# Patient Record
Sex: Female | Born: 1990 | Race: White | Hispanic: No | Marital: Married | Smoking: Current some day smoker
Health system: Southern US, Community
[De-identification: ages and names within clinical notes are randomized; demographics above are authoritative.]

## PROBLEM LIST (undated history)

## (undated) ENCOUNTER — Inpatient Hospital Stay: Payer: Self-pay

## (undated) DIAGNOSIS — G43909 Migraine, unspecified, not intractable, without status migrainosus: Secondary | ICD-10-CM

## (undated) DIAGNOSIS — F419 Anxiety disorder, unspecified: Secondary | ICD-10-CM

## (undated) DIAGNOSIS — F32A Depression, unspecified: Secondary | ICD-10-CM

## (undated) HISTORY — DX: Anxiety disorder, unspecified: F41.9

## (undated) HISTORY — DX: Depression, unspecified: F32.A

---
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
## 2009-04-20 ENCOUNTER — Emergency Department: Payer: Self-pay | Admitting: Emergency Medicine

## 2009-10-16 ENCOUNTER — Emergency Department: Payer: Self-pay | Admitting: Emergency Medicine

## 2010-08-31 ENCOUNTER — Emergency Department: Payer: Self-pay | Admitting: Emergency Medicine

## 2011-01-21 ENCOUNTER — Emergency Department: Payer: Self-pay | Admitting: Emergency Medicine

## 2011-09-01 ENCOUNTER — Emergency Department: Payer: Self-pay | Admitting: Emergency Medicine

## 2011-09-01 LAB — URINALYSIS, COMPLETE
Bilirubin,UR: NEGATIVE
Ketone: NEGATIVE
Protein: 100
RBC,UR: 352 /HPF (ref 0–5)
Specific Gravity: 1.016 (ref 1.003–1.030)
Squamous Epithelial: 4
WBC UR: 1822 /HPF (ref 0–5)

## 2011-09-01 LAB — PREGNANCY, URINE: Pregnancy Test, Urine: NEGATIVE m[IU]/mL

## 2012-01-12 ENCOUNTER — Emergency Department: Payer: Self-pay | Admitting: Emergency Medicine

## 2012-01-12 LAB — DRUG SCREEN, URINE
Barbiturates, Ur Screen: NEGATIVE (ref ?–200)
Benzodiazepine, Ur Scrn: NEGATIVE (ref ?–200)
Cannabinoid 50 Ng, Ur ~~LOC~~: NEGATIVE (ref ?–50)
Methadone, Ur Screen: NEGATIVE (ref ?–300)
Tricyclic, Ur Screen: NEGATIVE (ref ?–1000)

## 2012-01-12 LAB — CBC
HCT: 37.8 % (ref 35.0–47.0)
HGB: 13.5 g/dL (ref 12.0–16.0)
MCH: 32.2 pg (ref 26.0–34.0)
MCHC: 35.8 g/dL (ref 32.0–36.0)
Platelet: 292 10*3/uL (ref 150–440)
RBC: 4.19 10*6/uL (ref 3.80–5.20)
RDW: 13.4 % (ref 11.5–14.5)
WBC: 6.4 10*3/uL (ref 3.6–11.0)

## 2012-01-12 LAB — COMPREHENSIVE METABOLIC PANEL
Albumin: 4.5 g/dL (ref 3.4–5.0)
Alkaline Phosphatase: 72 U/L (ref 50–136)
Potassium: 3.5 mmol/L (ref 3.5–5.1)
SGOT(AST): 63 U/L — ABNORMAL HIGH (ref 15–37)
SGPT (ALT): 38 U/L (ref 12–78)
Total Protein: 8.2 g/dL (ref 6.4–8.2)

## 2012-01-12 LAB — URINALYSIS, COMPLETE
Bilirubin,UR: NEGATIVE
Glucose,UR: NEGATIVE mg/dL (ref 0–75)
Ketone: NEGATIVE
Nitrite: POSITIVE
Ph: 8 (ref 4.5–8.0)
RBC,UR: 57 /HPF (ref 0–5)
Squamous Epithelial: 2

## 2012-01-12 LAB — ETHANOL
Ethanol %: <0.003 % (ref 0.000–0.080)
Ethanol: <3 mg/dL

## 2012-01-12 LAB — TSH: Thyroid Stimulating Horm: 1.02 u[IU]/mL

## 2014-04-28 ENCOUNTER — Emergency Department
Admit: 2014-04-28 | Disposition: A | Discharge status: Home / Self Care | Payer: Self-pay | Admitting: Emergency Medicine

## 2015-04-20 ENCOUNTER — Encounter: Payer: Self-pay | Admitting: Emergency Medicine

## 2015-04-20 ENCOUNTER — Emergency Department
Admission: EM | Admit: 2015-04-20 | Discharge: 2015-04-20 | Disposition: A | Discharge status: Home / Self Care | Payer: Self-pay | Attending: Emergency Medicine | Admitting: Emergency Medicine

## 2015-04-20 DIAGNOSIS — R1013 Epigastric pain: Secondary | ICD-10-CM

## 2015-04-20 DIAGNOSIS — N390 Urinary tract infection, site not specified: Secondary | ICD-10-CM | POA: Insufficient documentation

## 2015-04-20 DIAGNOSIS — Z3202 Encounter for pregnancy test, result negative: Secondary | ICD-10-CM | POA: Insufficient documentation

## 2015-04-20 DIAGNOSIS — F17210 Nicotine dependence, cigarettes, uncomplicated: Secondary | ICD-10-CM | POA: Insufficient documentation

## 2015-04-20 LAB — URINALYSIS COMPLETE WITH MICROSCOPIC (ARMC ONLY)
BILIRUBIN URINE: NEGATIVE
GLUCOSE, UA: NEGATIVE mg/dL
Ketones, ur: NEGATIVE mg/dL
LEUKOCYTES UA: NEGATIVE
NITRITE: POSITIVE — AB
Protein, ur: NEGATIVE mg/dL
Specific Gravity, Urine: 1.012 (ref 1.005–1.030)
pH: 6 (ref 5.0–8.0)

## 2015-04-20 LAB — POCT PREGNANCY, URINE: Preg Test, Ur: NEGATIVE

## 2015-04-20 LAB — WET PREP, GENITAL
CLUE CELLS WET PREP: NONE SEEN
Sperm: NONE SEEN
Trich, Wet Prep: NONE SEEN
WBC WET PREP: NONE SEEN
YEAST WET PREP: NONE SEEN

## 2015-04-20 MED ORDER — CEPHALEXIN 500 MG PO CAPS: 500.0000 mg | ORAL_CAPSULE | Freq: Two times a day (BID) | ORAL | Status: DC

## 2015-04-20 MED ORDER — OMEPRAZOLE 40 MG PO CPDR: 40.0000 mg | DELAYED_RELEASE_CAPSULE | Freq: Every day | ORAL | Status: DC

## 2015-04-20 NOTE — Discharge Instructions (Signed)
Urinary Tract Infection A urinary tract infection (UTI) can occur any place along the urinary tract. The tract includes the kidneys, ureters, bladder, and urethra. A type of germ called bacteria often causes a UTI. UTIs are often helped with antibiotic medicine.  HOME CARE   If given, take antibiotics as told by your doctor. Finish them even if you start to feel better.  Drink enough fluids to keep your pee (urine) clear or pale yellow.  Avoid tea, drinks with caffeine, and bubbly (carbonated) drinks.  Pee often. Avoid holding your pee in for a long time.  Pee before and after having sex (intercourse).  Wipe from front to back after you poop (bowel movement) if you are a woman. Use each tissue only once. GET HELP RIGHT AWAY IF:   You have back pain.  You have lower belly (abdominal) pain.  You have chills.  You feel sick to your stomach (nauseous).  You throw up (vomit).  Your burning or discomfort with peeing does not go away.  You have a fever.  Your symptoms are not better in 3 days. MAKE SURE YOU:   Understand these instructions.  Will watch your condition.  Will get help right away if you are not doing well or get worse.   This information is not intended to replace advice given to you by your health care provider. Make sure you discuss any questions you have with your health care provider.   Document Released: 06/27/2007 Document Revised: 01/29/2014 Document Reviewed: 08/09/2011 Elsevier Interactive Patient Education [employer]2016 [employer]vier Inc.  Your urine lab shows a bladder infection. You should take the antibiotic as directed. Increase fluid intake to keep urine clear. Start the Nexium for any abdominal pain which may be related to reflux or gastritis. [facility] for continued symptoms.

## 2015-04-20 NOTE — ED Notes (Signed)
See triage note   States she has been having intermittent epigastric pain and dysuria for several months

## 2015-04-20 NOTE — ED Notes (Signed)
Pt presents to ED with epigastric pain and dysuria. Pt states burning upon urination has been for several months. Pt states has burning painful sensation in upper abdomen. Pt in NAD in triage.

## 2015-04-20 NOTE — ED Provider Notes (Signed)
[facility] Emergency Department Provider Note ____________________________________________  Time seen: 1022  I have reviewed the triage vital signs and the nursing notes.  HISTORY  Chief Complaint  Abdominal Pain and Dysuria  HPI Kaitlin Lewis is a 25 y.o. female reports to the EDfor evaluation of intermittent epigastric muscle pain and dysuria for several months. She has been dosing AZO for the last several weeks. She denies unintended weight loss, nausea, vomiting, or bowel changes. She reports the abdominal pain as tenderness to touch over the abdominal muscles. It is aggravated by bending, reaching and stretching the torso. She rates her discomfort at 8/10 in triage.   History reviewed. No pertinent past medical history.  There are no active problems to display for this patient.  History reviewed. No pertinent past surgical history.  Current Outpatient Rx  Name  Route  Sig  Dispense  Refill  . cephALEXin (KEFLEX) 500 MG capsule   Oral   Take 1 capsule (500 mg total) by mouth 2 (two) times daily.   14 capsule   0   . omeprazole (PRILOSEC) 40 MG capsule   Oral   Take 1 capsule (40 mg total) by mouth daily.   30 capsule   0    Allergies Review of patient's allergies indicates no known allergies.  No family history on file.  Social History Social History  Substance Use Topics  . Smoking status: Current Some Day Smoker    Types: Cigarettes  . Smokeless tobacco: None  . Alcohol Use: Yes     Comment: weekends   Review of Systems  Constitutional: Negative for fever. Cardiovascular: Negative for chest pain. Respiratory: Negative for shortness of breath. Gastrointestinal: Negative for abdominal pain, vomiting and diarrhea. Genitourinary: Positive for dysuria. Musculoskeletal: Negative for back pain. Reports abdominal wall pain as above. Skin: Negative for rash. Neurological: Negative for headaches, focal weakness or  numbness. ____________________________________________  PHYSICAL EXAM:  VITAL SIGNS: ED Triage Vitals  Enc Vitals Group     BP 04/20/15 0914 136/92 mmHg     Pulse Rate 04/20/15 0914 84     Resp 04/20/15 0914 18     Temp 04/20/15 0914 98.3 F (36.8 C)     Temp Source 04/20/15 0914 Oral     SpO2 04/20/15 0914 99 %     Weight 04/20/15 0914 125 lb (56.7 kg)     Height 04/20/15 0914  (1.575 m)     Head Cir --      Peak Flow --      Pain Score 04/20/15 0915 8     Pain Loc --      Pain Edu? --      Excl. in GC? --    Constitutional: Alert and oriented. Well appearing and in no distress. Head: Normocephalic and atraumatic. Eyes: Conjunctivae are normal. PERRL. Normal extraocular movements Hematological/Lymphatic/Immunological: No cervical lymphadenopathy. Cardiovascular: Normal rate, regular rhythm.  Respiratory: Normal respiratory effort. No wheezes/rales/rhonchi. Gastrointestinal: Soft and nontender. Normal bowel sounds. No distention, rebound, guarding, or organomegaly. No CVA tenderness. Patient with tenderness to the abdominal wall in the epigastric region. No abdominal wall defect, hernia, or lesions.  Musculoskeletal: Nontender with normal range of motion in all extremities.  Neurologic:  Normal gait without ataxia. Normal speech and language. No gross focal neurologic deficits are appreciated. Skin:  Skin is warm, dry and intact. No rash noted. Psychiatric: Mood and affect are normal. Patient exhibits appropriate insight and judgment. ____________________________________________   LABS (pertinent positives/negatives) Labs  Reviewed  URINALYSIS COMPLETEWITH MICROSCOPIC ([facility] ONLY) - Abnormal; Notable for the following:    Color, Urine YELLOW (*)    APPearance CLOUDY (*)    Hgb urine dipstick 2+ (*)    Nitrite POSITIVE (*)    Bacteria, UA RARE (*)    Squamous Epithelial / LPF TOO NUMEROUS TO COUNT (*)    All other components within normal limits  WET PREP, GENITAL   POC URINE PREG, ED  POCT PREGNANCY, URINE  ____________________________________________  ED ECG REPORT   Date: 04/20/2015  EKG Time: 0908  Rate: 70  Rhythm: normal sinus rhythm,  normal EKG, normal sinus rhythm, there are no previous tracings available for comparison Intervals:none ST&T Change: none Narrative Interpretation: normal ___________________________________________  INITIAL IMPRESSION / ASSESSMENT AND PLAN / ED COURSE  Patient with lab confirmation of a UTI and exam showing abdominal wall pain without an acute process. She will be discharged with a prescription for Keflex and omeprazole. She will follow-up with one of the community clinics or [facility] as needed.  ____________________________________________  FINAL CLINICAL IMPRESSION(S) / ED DIAGNOSES  Final diagnoses:  Abdominal wall pain in epigastric region  UTI (lower urinary tract infection)      Lissa HoardJenise V Bacon Gail Vendetti, PA-C 04/20/15 1631  Jeanmarie PlantJames A McShane, MD 04/21/15 1350

## 2016-03-01 ENCOUNTER — Encounter: Payer: Self-pay | Admitting: Emergency Medicine

## 2016-03-01 ENCOUNTER — Emergency Department
Admission: EM | Admit: 2016-03-01 | Discharge: 2016-03-01 | Disposition: A | Discharge status: Home / Self Care | Payer: Self-pay | Attending: Emergency Medicine | Admitting: Emergency Medicine

## 2016-03-01 DIAGNOSIS — N644 Mastodynia: Secondary | ICD-10-CM | POA: Insufficient documentation

## 2016-03-01 DIAGNOSIS — F17210 Nicotine dependence, cigarettes, uncomplicated: Secondary | ICD-10-CM | POA: Insufficient documentation

## 2016-03-01 MED ORDER — CEPHALEXIN 500 MG PO CAPS: 500.0000 mg | ORAL_CAPSULE | Freq: Three times a day (TID) | ORAL | 0 refills | Status: DC

## 2016-03-01 NOTE — ED Provider Notes (Signed)
[facility] Emergency Department Provider Note  ____________________________________________   First MD Initiated Contact with Patient 03/01/16 1052     (approximate)  I have reviewed the triage vital signs and the nursing notes.   HISTORY  Chief Complaint Painful lump right breast   HPI Kaitlin Lewis is a 26 y.o. female is here with complaint of lump to her right breast for 2 months. Patient states that she has had her right nipple pierced twice but had this removed approximately 2 months ago. Patient has seen green discharge from this area off and on. Patient denies any fever or chills. She currently has not seen any recent drainage but states the area is very tender. She's also had same symptoms to the left breast. Currently she rates her pain as 10 over 10.   History reviewed. No pertinent past medical history.  There are no active problems to display for this patient.   No past surgical history on file.  Prior to Admission medications   Medication Sig Start Date End Date Taking? Authorizing Provider  cephALEXin (KEFLEX) 500 MG capsule Take 1 capsule (500 mg total) by mouth 3 (three) times daily. 03/01/16   Tommi Rumpshonda L Madigan [employer], PA-C    Allergies Patient has no known allergies.  No family history on file.  Social History Social History  Substance Use Topics  . Smoking status: Current Some Day Smoker    Types: Cigarettes  . Smokeless tobacco: Not on file  . Alcohol use Yes     Comment: weekends    Review of Systems Constitutional: No fever/chills .Cardiovascular: Denies chest pain. Respiratory: Denies shortness of breath. Gastrointestinal: No abdominal pain.  No nausea, no vomiting.  Musculoskeletal: Negative for back pain. Skin: Positive bilateral pierced  Nipples.  Neurological: Negative for headaches, focal weakness or numbness.  10-point ROS otherwise negative.  ____________________________________________   PHYSICAL  EXAM:  VITAL SIGNS: ED Triage Vitals [03/01/16 1009]  Enc Vitals Group     BP 116/84     Pulse Rate 76     Resp 18     Temp 98 F (36.7 C)     Temp Source Oral     SpO2 100 %     Weight 123 lb (55.8 kg)     Height 5\' 2"  (1.575 m)     Head Circumference      Peak Flow      Pain Score 10     Pain Loc      Pain Edu?      Excl. in GC?     Constitutional: Alert and oriented. Well appearing and in no acute distress. Eyes: Conjunctivae are normal. PERRL. EOMI. Head: Atraumatic. Nose: No congestion/rhinnorhea. Neck: No stridor.   Cardiovascular: Normal rate, regular rhythm. Grossly normal heart sounds.  Good peripheral circulation. Respiratory: Normal respiratory effort.  No retractions. Lungs CTAB. Musculoskeletal: Moves upper and lower extremities without any difficulty. Normal gait was noted. Neurologic:  Normal speech and language. No gross focal neurologic deficits are appreciated. No gait instability. Skin:  Skin is warm, dry. Right nipple there is 2 pierced areas present. There is tenderness on palpation and slightly warm to touch. There is no active drainage noted at this time. Psychiatric: Mood and affect are normal. Speech and behavior are normal.  ____________________________________________   LABS (all labs ordered are listed, but only abnormal results are displayed)  Labs Reviewed - No data to display   PROCEDURES  Procedure(s) performed: None  Procedures  Critical  Care performed: No  ____________________________________________   INITIAL IMPRESSION / ASSESSMENT AND PLAN / ED COURSE  Pertinent labs & imaging results that were available during my care of the patient were reviewed by me and considered in my medical decision making (see chart for details).  Patient was told to begin using warm moist compresses to the area. Patient was started on Keflex 500 mg 3 times a day for 10 days. She is also told to continue taking Tylenol or ibuprofen as needed for  pain. She is to follow-up with [facility] or one of the health centers available to her set she does not have any insurance. She was told that most likely she would need to be evaluated for an abscess that there is no improvement. She is return to the emergency room if any severe worsening of her symptoms.      ____________________________________________   FINAL CLINICAL IMPRESSION(S) / ED DIAGNOSES  Final diagnoses:  Pain of right breast      NEW MEDICATIONS STARTED DURING THIS VISIT:  Discharge Medication List as of 03/01/2016 11:32 AM       Note:  This document was prepared using Dragon voice recognition software and may include unintentional dictation errors.    Tommi Rumps, PA-C 03/01/16 1550    Jeanmarie Plant, MD 03/02/16 1524

## 2016-03-01 NOTE — ED Triage Notes (Signed)
Pt reports painful lump to right breast for two months. Pt reports has piercing on right nipple but removed it. Pt reports has seen green discharge from lump.

## 2016-03-01 NOTE — ED Notes (Signed)
Pt ambulatory to ER via POV. Pt alert and oriented X4, active, cooperative, pt in NAD. RR even and unlabored, color WNL.

## 2016-03-01 NOTE — ED Notes (Signed)
States pain in her right brests, states greenish nipple discharge, states hx of piecing's, upon visual assessment no abnormality noted but pt states tenderness and swelling

## 2016-03-01 NOTE — Discharge Instructions (Signed)
Follow-up with your primary care doctor or one of the clinics listed above if you do not already have a primary care doctor. [facility]'re also [facility]. Continue using warm compresses as we discussed. Begin taking antibiotics for the whole 10 days. You may take Tylenol or ibuprofen if needed for pain.

## 2016-04-24 ENCOUNTER — Encounter: Payer: Self-pay | Admitting: Emergency Medicine

## 2016-04-24 ENCOUNTER — Emergency Department: Payer: Self-pay

## 2016-04-24 ENCOUNTER — Emergency Department
Admission: EM | Admit: 2016-04-24 | Discharge: 2016-04-24 | Disposition: A | Discharge status: Home / Self Care | Payer: Self-pay | Attending: Emergency Medicine | Admitting: Emergency Medicine

## 2016-04-24 DIAGNOSIS — S20469A Insect bite (nonvenomous) of unspecified back wall of thorax, initial encounter: Secondary | ICD-10-CM | POA: Insufficient documentation

## 2016-04-24 DIAGNOSIS — F17210 Nicotine dependence, cigarettes, uncomplicated: Secondary | ICD-10-CM | POA: Insufficient documentation

## 2016-04-24 DIAGNOSIS — Y999 Unspecified external cause status: Secondary | ICD-10-CM | POA: Insufficient documentation

## 2016-04-24 DIAGNOSIS — R0602 Shortness of breath: Secondary | ICD-10-CM | POA: Insufficient documentation

## 2016-04-24 DIAGNOSIS — R0781 Pleurodynia: Secondary | ICD-10-CM | POA: Insufficient documentation

## 2016-04-24 DIAGNOSIS — Y929 Unspecified place or not applicable: Secondary | ICD-10-CM | POA: Insufficient documentation

## 2016-04-24 DIAGNOSIS — W57XXXA Bitten or stung by nonvenomous insect and other nonvenomous arthropods, initial encounter: Secondary | ICD-10-CM | POA: Insufficient documentation

## 2016-04-24 DIAGNOSIS — Y939 Activity, unspecified: Secondary | ICD-10-CM | POA: Insufficient documentation

## 2016-04-24 LAB — TROPONIN I
Troponin I: <0.03 ng/mL (ref <0.03)
Troponin I: <0.03 ng/mL (ref <0.03)

## 2016-04-24 LAB — BASIC METABOLIC PANEL
Anion gap: 8 (ref 5–15)
BUN: 7 mg/dL (ref 6–20)
CHLORIDE: 103 mmol/L (ref 101–111)
CO2: 26 mmol/L (ref 22–32)
Calcium: 9.4 mg/dL (ref 8.9–10.3)
Creatinine, Ser: 0.79 mg/dL (ref 0.44–1.00)
GFR calc non Af Amer: >60 mL/min (ref >60)
Glucose, Bld: 109 mg/dL — ABNORMAL HIGH (ref 65–99)
POTASSIUM: 3.7 mmol/L (ref 3.5–5.1)
SODIUM: 137 mmol/L (ref 135–145)

## 2016-04-24 LAB — CBC
HEMATOCRIT: 33.6 % — AB (ref 35.0–47.0)
HEMOGLOBIN: 11.6 g/dL — AB (ref 12.0–16.0)
MCH: 31.4 pg (ref 26.0–34.0)
MCHC: 34.4 g/dL (ref 32.0–36.0)
MCV: 91.3 fL (ref 80.0–100.0)
PLATELETS: 359 10*3/uL (ref 150–440)
RBC: 3.68 MIL/uL — AB (ref 3.80–5.20)
RDW: 13.8 % (ref 11.5–14.5)
WBC: 3.6 10*3/uL (ref 3.6–11.0)

## 2016-04-24 LAB — POCT PREGNANCY, URINE: PREG TEST UR: NEGATIVE

## 2016-04-24 MED ORDER — OXYCODONE-ACETAMINOPHEN 5-325 MG PO TABS: 1.0000 | ORAL_TABLET | Freq: Four times a day (QID) | ORAL | 0 refills | Status: DC | PRN

## 2016-04-24 MED ORDER — IOPAMIDOL (ISOVUE-370) INJECTION 76%
75.0000 mL | Freq: Once | INTRAVENOUS | Status: AC | PRN
  Administered 2016-04-24: 75 mL via INTRAVENOUS
  Filled 2016-04-24: qty 75

## 2016-04-24 MED ORDER — DOXYCYCLINE HYCLATE 100 MG PO CAPS: 100.0000 mg | ORAL_CAPSULE | Freq: Two times a day (BID) | ORAL | 0 refills | Status: DC

## 2016-04-24 MED ORDER — KETOROLAC TROMETHAMINE 30 MG/ML IJ SOLN
30.0000 mg | Freq: Once | INTRAMUSCULAR | Status: AC
  Administered 2016-04-24: 30 mg via INTRAVENOUS

## 2016-04-24 MED ORDER — KETOROLAC TROMETHAMINE 30 MG/ML IJ SOLN
INTRAMUSCULAR | Status: AC
  Filled 2016-04-24: qty 1

## 2016-04-24 MED ORDER — OXYCODONE-ACETAMINOPHEN 5-325 MG PO TABS
1.0000 | ORAL_TABLET | ORAL | Status: AC
  Administered 2016-04-24: 1 via ORAL
  Filled 2016-04-24: qty 1

## 2016-04-24 MED ORDER — IPRATROPIUM-ALBUTEROL 0.5-2.5 (3) MG/3ML IN SOLN
3.0000 mL | Freq: Once | RESPIRATORY_TRACT | Status: AC
  Administered 2016-04-24: 3 mL via RESPIRATORY_TRACT
  Filled 2016-04-24: qty 3

## 2016-04-24 MED ORDER — ALBUTEROL SULFATE HFA 108 (90 BASE) MCG/ACT IN AERS: 2.0000 | INHALATION_SPRAY | Freq: Four times a day (QID) | RESPIRATORY_TRACT | 2 refills | Status: DC | PRN

## 2016-04-24 NOTE — ED Triage Notes (Signed)
Pt to ed with c/o chest pain x 3 days,  Pt states after she chain smokes the pain happens,  Pt reports sob with chest pain.

## 2016-04-24 NOTE — Discharge Instructions (Signed)
Please take any prescribed medications as instructed.  Follow up with your regular physician as instructed above, and return to the Emergency Department (ED) if you are unable to tolerate fluids due to vomiting, have worsening trouble breathing, become extremely tired or difficult to awaken, pain worsens, you have a fever, weakness, or if you develop any other symptoms that concern you.

## 2016-04-24 NOTE — ED Notes (Signed)
Collected cbc, bmp, troponin, sent to lab.  

## 2016-04-24 NOTE — ED Provider Notes (Signed)
[facility] Emergency Department Provider Note   ____________________________________________   First MD Initiated Contact with Patient 04/24/16 1806     (approximate)  I have reviewed the triage vital signs and the nursing notes.   HISTORY  Chief Complaint Chest Pain    HPI Kaitlin Lewis is a 26 y.o. female  she's having severe sharp pain in the left chest and long area since Sunday night.  Reports that she was smoking quite a bit, and at times severe chest discomfort after smoking, but beginning yesterday and some on Sunday night she's been having fairly severe pain with any type of deep breath and a feeling of slight shortness of breath like the "pain takes breath away" over the left chest and  She is not any birth control. Denies any history of blood clots, recent hospitalizations or trauma. She has not noticed any leg swelling. She has no personal history of heart disease. Denies any heavy chest pressure, but rather describes a very sharp left-sided chest pain worse by inspiration and movement. Also reports tobacco or throat feels a little irritated, describes this happens after smoking as well. She tells me that she is stop smoking, but she threw her cigarettes out and has not had anything else to smoke since Sunday.  Unclear of her family history, she does report however that she is not aware of any heart problems in her mom or dad that her mother has a long-standing history of drug and substance abuse, and her father is a heavy alcoholic  History reviewed. No pertinent past medical history.  There are no active problems to display for this patient.   History reviewed. No pertinent surgical history.  Prior to Admission medications   Medication Sig Start Date End Date Taking? Authorizing Provider  albuterol (PROVENTIL HFA;VENTOLIN HFA) 108 (90 Base) MCG/ACT inhaler Inhale 2 puffs into the lungs every 6 (six) hours as needed for wheezing or  shortness of breath. 04/24/16   Sharyn Creamer, MD  cephALEXin (KEFLEX) 500 MG capsule Take 1 capsule (500 mg total) by mouth 3 (three) times daily. 03/01/16   Tommi Rumps, PA-C  doxycycline (VIBRAMYCIN) 100 MG capsule Take 1 capsule (100 mg total) by mouth 2 (two) times daily. 04/24/16   Sharyn Creamer, MD  oxyCODONE-acetaminophen (ROXICET) 5-325 MG tablet Take 1 tablet by mouth every 6 (six) hours as needed for severe pain. 04/24/16   Sharyn Creamer, MD    Allergies Patient has no known allergies.  No family history on file.  Social History Social History  Substance Use Topics  . Smoking status: Current Some Day Smoker    Types: Cigarettes  . Smokeless tobacco: Never Used  . Alcohol use Yes     Comment: weekends    Review of Systems Constitutional: No fever/chills Eyes: No visual changes. ENT: No sore throat. Cardiovascular: see history of present illness Respiratory:See history of present illness. denies any wheezing. Gastrointestinal: No abdominal pain.  No nausea, no vomiting.   Genitourinary: Negative for dysuria.denies pregnancy. Musculoskeletal: Negative for back pain. Skin: Negative for rash. Neurological: Negative for headaches, focal weakness or numbness.  10-point ROS otherwise negative.  ____________________________________________   PHYSICAL EXAM:  VITAL SIGNS: ED Triage Vitals [04/24/16 1618]  Enc Vitals Group     BP 135/86     Pulse Rate 81     Resp 18     Temp 98.6 F (37 C)     Temp Source Oral     SpO2 100 %  Weight 120 lb (54.4 kg)     Height  (1.575 m)     Head Circumference      Peak Flow      Pain Score 10     Pain Loc      Pain Edu?      Excl. in GC?     Constitutional: Alert and oriented. Splinting somewhat, holdingher left chest with her hand reporting severe sharp pain in the left chest with takes deep breath. Moderate pain Eyes: Conjunctivae are normal. PERRL. EOMI. Head: Atraumatic. Nose: No congestion/rhinnorhea. Mouth/Throat:  Mucous membranes are moist.  Oropharynx non-erythematous. Neck: No stridor.   Cardiovascular: Normal rate, regular rhythm. Grossly normal heart sounds.  Good peripheral circulation. Respiratory: Normal respiratory effort.  Splints when taking a deep breath. No wheezing. Able speak in full sentences which are clearNo retractions. Lungs CTAB. Gastrointestinal: Soft and nontender. No distention.  Musculoskeletal: No lower extremity tenderness nor edema.  No joint effusions. Neurologic:  Normal speech and language. No gross focal neurologic deficits are appreciated.  Skin:  Skin is warm, dry and intact. No rash noted. Psychiatric: Mood and affect are normal. Speech and behavior are normal.  ____________________________________________   LABS (all labs ordered are listed, but only abnormal results are displayed)  Labs Reviewed  BASIC METABOLIC PANEL - Abnormal; Notable for the following:       Result Value   Glucose, Bld 109 (*)    All other components within normal limits  CBC - Abnormal; Notable for the following:    RBC 3.68 (*)    Hemoglobin 11.6 (*)    HCT 33.6 (*)    All other components within normal limits  TROPONIN I  TROPONIN I  POC URINE PREG, ED  POCT PREGNANCY, URINE   ____________________________________________  EKG  Reviewed and interpreted by me at 1622 Heart rate 90 QRS 70 QTc 450 No acute ischemic changes are noted, slight RSR prime pattern is noted in V1 and V2 which is nonspecific. No evidence of acute ischemia ____________________________________________  RADIOLOGY  Dg Chest 2 View  Result Date: 04/24/2016 CLINICAL DATA:  Chest pain for 3 days EXAM: CHEST  2 VIEW COMPARISON:  12/08/2007 FINDINGS: Cardiomediastinal silhouette is stable. No infiltrate or pleural effusion. No pulmonary edema. Bony thorax is unremarkable. IMPRESSION: No active cardiopulmonary disease. Electronically Signed   By: Natasha Mead M.D.   On: 04/24/2016 16:52   Ct Angio Chest Pe W Or  Wo Contrast  Result Date: 04/24/2016 CLINICAL DATA:  Chest pain and shortness of breath EXAM: CT ANGIOGRAPHY CHEST WITH CONTRAST TECHNIQUE: Multidetector CT imaging of the chest was performed using the standard protocol during bolus administration of intravenous contrast. Multiplanar CT image reconstructions and MIPs were obtained to evaluate the vascular anatomy. CONTRAST:  75 mL Isovue 370 nonionic COMPARISON:  Chest radiograph   April 24, 2016 FINDINGS: Cardiovascular: There is no demonstrable pulmonary embolus. There is no thoracic aortic aneurysm or dissection. The visualized great vessels appear normal. There is no appreciable pericardial thickening. Mediastinum/Nodes: Visualized thyroid appears normal. There is no demonstrable adenopathy. Lungs/Pleura: Lungs are clear. No pleural effusion or pleural thickening. Upper Abdomen: Visualized upper abdominal structures appear normal. Musculoskeletal: There is slight mid thoracic dextroscoliosis. No blastic or lytic bone lesions. No chest wall lesions evident. Review of the MIP images confirms the above findings. IMPRESSION: No demonstrable pulmonary embolus. No edema or consolidation. No adenopathy. Electronically Signed   By: Bretta Bang III M.D.   On: 04/24/2016 18:57  ____________________________________________   PROCEDURES  Procedure(s) performed: None  Procedures  Critical Care performed: No  ____________________________________________   INITIAL IMPRESSION / ASSESSMENT AND PLAN / ED COURSE  Pertinent labs & imaging results that were available during my care of the patient were reviewed by me and considered in my medical decision making (see chart for details).  Chest pain, atypical acute coronary syndrome. Reassuring EKG and troponin. Low risk by heart score. Primarily pleuritic in nature, possibly due to irritation, pleurisy, related to smoking but we'll exclude pulmonary embolism given severity of symptoms, reported shortness  of breath, and significant pleuritic nose of the symptoms in association with smoking.  Personal pretest probability of pulmonary embolism as somewhat moderate to low at this time, given her smoking history and report of severe pleuritic pain with shortness of breath and a CT scan.    ----------------------------------------- 8:08 PM on 04/24/2016 -----------------------------------------  Ambulatory. Feels improved. Clear lungs no wheezing or evidence of increased work of breathing. Speaking in full clear sentences. Reports her pain is mild now, and much more tolerable with persistent slight pleuritic component over the left chest.  Feel patient may be discharged home, brief course of pain medication with recommendation also utilize over-the-counter ibuprofen. Doxycycline also prescribed for 2 tick bites which she's had on her back and had to have removed a couple days ago.  Return precautions and treatment recommendations and follow-up discussed with the patient who is agreeable with the plan.  I will prescribe the patient a narcotic pain medicine due to their condition which I anticipate will cause at least moderate pain short term. I discussed with the patient safe use of narcotic pain medicines, and that they are not to drive, work in dangerous areas, or ever take more than prescribed (no more than 1 pill every 6 hours). We discussed that this is the type of medication that can be  overdosed on and the risks of this type of medicine. Patient is very agreeable to only use as prescribed and to never use more than prescribed.   Husband driving her home  ____________________________________________   FINAL CLINICAL IMPRESSION(S) / ED DIAGNOSES  Final diagnoses:  Pleuritic chest pain  Tick bite, initial encounter      NEW MEDICATIONS STARTED DURING THIS VISIT:  New Prescriptions   ALBUTEROL (PROVENTIL HFA;VENTOLIN HFA) 108 (90 BASE) MCG/ACT INHALER    Inhale 2 puffs into the lungs  every 6 (six) hours as needed for wheezing or shortness of breath.   DOXYCYCLINE (VIBRAMYCIN) 100 MG CAPSULE    Take 1 capsule (100 mg total) by mouth 2 (two) times daily.   OXYCODONE-ACETAMINOPHEN (ROXICET) 5-325 MG TABLET    Take 1 tablet by mouth every 6 (six) hours as needed for severe pain.     Note:  This document was prepared using Dragon voice recognition software and may include unintentional dictation errors.     Sharyn Creamer, MD 04/24/16 2009

## 2016-07-18 ENCOUNTER — Emergency Department
Admission: EM | Admit: 2016-07-18 | Discharge: 2016-07-18 | Disposition: A | Discharge status: Home / Self Care | Payer: Self-pay | Attending: Emergency Medicine | Admitting: Emergency Medicine

## 2016-07-18 ENCOUNTER — Encounter: Payer: Self-pay | Admitting: *Deleted

## 2016-07-18 DIAGNOSIS — N3000 Acute cystitis without hematuria: Secondary | ICD-10-CM

## 2016-07-18 DIAGNOSIS — N3090 Cystitis, unspecified without hematuria: Secondary | ICD-10-CM | POA: Insufficient documentation

## 2016-07-18 DIAGNOSIS — F17210 Nicotine dependence, cigarettes, uncomplicated: Secondary | ICD-10-CM | POA: Insufficient documentation

## 2016-07-18 LAB — URINALYSIS, COMPLETE (UACMP) WITH MICROSCOPIC
Bilirubin Urine: NEGATIVE
GLUCOSE, UA: NEGATIVE mg/dL
Ketones, ur: NEGATIVE mg/dL
NITRITE: POSITIVE — AB
PH: 6 (ref 5.0–8.0)
Protein, ur: NEGATIVE mg/dL
SPECIFIC GRAVITY, URINE: 1.014 (ref 1.005–1.030)

## 2016-07-18 LAB — POCT PREGNANCY, URINE: PREG TEST UR: NEGATIVE

## 2016-07-18 MED ORDER — SULFAMETHOXAZOLE-TRIMETHOPRIM 800-160 MG PO TABS: 1.0000 | ORAL_TABLET | Freq: Two times a day (BID) | ORAL | 0 refills | Status: DC

## 2016-07-18 NOTE — ED Provider Notes (Signed)
[facility] Emergency Department Provider Note  ____________________________________________   First MD Initiated Contact with Patient 07/18/16 1044     (approximate)  I have reviewed the triage vital signs and the nursing notes.   HISTORY  Chief Complaint Dysuria    HPI Kaitlin Lewis is a 26 y.o. female is here with complaint of dysuria for the last 3 days. Patient states that she feels that this is a urinary tract infection and she has had them in the past. She denies any vaginal symptoms. There is been no nausea, vomiting, fever or chills. She denies any back pain.   History reviewed. No pertinent past medical history.  There are no active problems to display for this patient.   History reviewed. No pertinent surgical history.  Prior to Admission medications   Medication Sig Start Date End Date Taking? Authorizing Provider  sulfamethoxazole-trimethoprim (BACTRIM DS,SEPTRA DS) 800-160 MG tablet Take 1 tablet by mouth 2 (two) times daily. 07/18/16   Tommi Rumps, PA-C    Allergies Patient has no known allergies.  History reviewed. No pertinent family history.  Social History Social History  Substance Use Topics  . Smoking status: Current Some Day Smoker    Types: Cigarettes  . Smokeless tobacco: Never Used  . Alcohol use Yes     Comment: weekends    Review of Systems  Constitutional: No fever/chills Cardiovascular: Denies chest pain. Respiratory: Denies shortness of breath. Gastrointestinal: No abdominal pain.  No nausea, no vomiting.  Genitourinary: Positive for dysuria. Musculoskeletal: Negative for back pain. Skin: Negative for rash. Neurological: Negative for headaches, focal weakness or numbness.   ____________________________________________   PHYSICAL EXAM:  VITAL SIGNS: ED Triage Vitals [07/18/16 0923]  Enc Vitals Group     BP (!) 126/95     Pulse Rate 88     Resp 18     Temp 97.9 F (36.6 C)     Temp  Source Oral     SpO2 100 %     Weight 123 lb (55.8 kg)     Height 5\' 2"  (1.575 m)     Head Circumference      Peak Flow      Pain Score      Pain Loc      Pain Edu?      Excl. in GC?     Constitutional: Alert and oriented. Well appearing and in no acute distress. Eyes: Conjunctivae are normal. PERRL. EOMI. Head: Atraumatic. Neck: No stridor.   Cardiovascular: Normal rate, regular rhythm. Grossly normal heart sounds.  Good peripheral circulation. Respiratory: Normal respiratory effort.  No retractions. Lungs CTAB. Gastrointestinal: Soft and nontender. No distention.  No CVA tenderness. Musculoskeletal: No lower extremity tenderness nor edema.  No joint effusions. Neurologic:  Normal speech and language. No gross focal neurologic deficits are appreciated. No gait instability. Skin:  Skin is warm, dry and intact. No rash noted. Psychiatric: Mood and affect are normal. Speech and behavior are normal.  ____________________________________________   LABS (all labs ordered are listed, but only abnormal results are displayed)  Labs Reviewed  URINALYSIS, COMPLETE (UACMP) WITH MICROSCOPIC - Abnormal; Notable for the following:       Result Value   Color, Urine YELLOW (*)    APPearance CLOUDY (*)    Hgb urine dipstick MODERATE (*)    Nitrite POSITIVE (*)    Leukocytes, UA LARGE (*)    Bacteria, UA MANY (*)    Squamous Epithelial / LPF 6-30 (*)  All other components within normal limits  POC URINE PREG, ED  POCT PREGNANCY, URINE    PROCEDURES  Procedure(s) performed: None  Procedures  Critical Care performed: No  ____________________________________________   INITIAL IMPRESSION / ASSESSMENT AND PLAN / ED COURSE  Pertinent labs & imaging results that were available during my care of the patient were reviewed by me and considered in my medical decision making (see chart for details).  Patient was made aware that she does have a cystitis. She is encouraged to drink lots  of fluids. She is given a prescription for Bactrim DS twice a day for 10 days. She'll follow up with [facility] if any continued problems.      ____________________________________________   FINAL CLINICAL IMPRESSION(S) / ED DIAGNOSES  Final diagnoses:  Acute cystitis without hematuria      NEW MEDICATIONS STARTED DURING THIS VISIT:  Discharge Medication List as of 07/18/2016 10:57 AM    START taking these medications   Details  sulfamethoxazole-trimethoprim (BACTRIM DS,SEPTRA DS) 800-160 MG tablet Take 1 tablet by mouth 2 (two) times daily., Starting Wed 07/18/2016, Print         Note:  This document was prepared using Dragon voice recognition software and may include unintentional dictation errors.    Tommi RumpsSummers, Meghin Thivierge L, PA-C 07/18/16 1603    Sharman CheekStafford, Phillip, MD 07/20/16 [phone]

## 2016-07-18 NOTE — Discharge Instructions (Signed)
Begin taking antibiotic today and twice a day for the next 10 days. Increase fluids. You may also take Tylenol or ibuprofen if needed for pain. Follow[facility] with [facility] if any continued problems.

## 2016-07-18 NOTE — ED Notes (Signed)
See triage note  States she developed some urinary freq and dysuria about 3 days ago  Denies any n/v/ or fever

## 2016-07-18 NOTE — ED Triage Notes (Signed)
Pt states burning upon urination and frequency for 3 days

## 2017-04-09 ENCOUNTER — Encounter: Payer: Self-pay | Admitting: Emergency Medicine

## 2017-04-09 ENCOUNTER — Emergency Department
Admission: EM | Admit: 2017-04-09 | Discharge: 2017-04-09 | Disposition: A | Discharge status: Home / Self Care | Payer: Self-pay | Attending: Emergency Medicine | Admitting: Emergency Medicine

## 2017-04-09 ENCOUNTER — Other Ambulatory Visit: Payer: Self-pay

## 2017-04-09 DIAGNOSIS — F17210 Nicotine dependence, cigarettes, uncomplicated: Secondary | ICD-10-CM | POA: Insufficient documentation

## 2017-04-09 DIAGNOSIS — N390 Urinary tract infection, site not specified: Secondary | ICD-10-CM | POA: Insufficient documentation

## 2017-04-09 DIAGNOSIS — R350 Frequency of micturition: Secondary | ICD-10-CM | POA: Insufficient documentation

## 2017-04-09 LAB — URINALYSIS, COMPLETE (UACMP) WITH MICROSCOPIC
Bilirubin Urine: NEGATIVE
Glucose, UA: NEGATIVE mg/dL
Ketones, ur: NEGATIVE mg/dL
NITRITE: NEGATIVE
PROTEIN: NEGATIVE mg/dL
SPECIFIC GRAVITY, URINE: 1.006 (ref 1.005–1.030)
pH: 6 (ref 5.0–8.0)

## 2017-04-09 LAB — POCT PREGNANCY, URINE: PREG TEST UR: NEGATIVE

## 2017-04-09 MED ORDER — SULFAMETHOXAZOLE-TRIMETHOPRIM 800-160 MG PO TABS: 1.0000 | ORAL_TABLET | Freq: Two times a day (BID) | ORAL | 0 refills | Status: DC

## 2017-04-09 NOTE — ED Provider Notes (Signed)
[facility] Emergency Department Provider Note  ____________________________________________   First MD Initiated Contact with Patient 04/09/17 1731     (approximate)  I have reviewed the triage vital signs and the nursing notes.   HISTORY  Chief Complaint Dysuria    HPI Kaitlin Lewis is a 27 y.o. female presents emergency department complaining of urinary frequency and pain.  She states symptoms have been present for 2 days.  She has a history of multiple UTIs and pyelonephritis.  She denies any fever or chills.  She denies any vomiting or diarrhea.  States she does have some low back pain.  She was last treated for a UTI in December.  She was given Keflex at that time.  History reviewed. No pertinent past medical history.  There are no active problems to display for this patient.   History reviewed. No pertinent surgical history.  Prior to Admission medications   Medication Sig Start Date End Date Taking? Authorizing Provider  sulfamethoxazole-trimethoprim (BACTRIM DS,SEPTRA DS) 800-160 MG tablet Take 1 tablet by mouth 2 (two) times daily. 04/09/17   Faythe Ghee, PA-C    Allergies Patient has no known allergies.  No family history on file.  Social History Social History   Tobacco Use  . Smoking status: Current Some Day Smoker    Types: Cigarettes  . Smokeless tobacco: Never Used  Substance Use Topics  . Alcohol use: Yes    Comment: weekends  . Drug use: No    Review of Systems  Constitutional: No fever/chills Eyes: No visual changes. ENT: No sore throat. Respiratory: Denies cough Genitourinary: Positive for dysuria.  Denies vaginal discharge Musculoskeletal: Negative for back pain. Skin: Negative for rash.    ____________________________________________   PHYSICAL EXAM:  VITAL SIGNS: ED Triage Vitals  Enc Vitals Group     BP 04/09/17 1550 104/68     Pulse Rate 04/09/17 1550 85     Resp 04/09/17 1550 16     Temp  04/09/17 1550 98 F (36.7 C)     Temp Source 04/09/17 1550 Oral     SpO2 04/09/17 1550 99 %     Weight --      Height --      Head Circumference --      Peak Flow --      Pain Score 04/09/17 1532 8     Pain Loc --      Pain Edu? --      Excl. in GC? --     Constitutional: Alert and oriented. Well appearing and in no acute distress. Eyes: Conjunctivae are normal.  Head: Atraumatic. Nose: No congestion/rhinnorhea. Mouth/Throat: Mucous membranes are moist.   Cardiovascular: Normal rate, regular rhythm.  Heart sounds are normal Respiratory: Normal respiratory effort.  No retractions, lungs are clear to auscultation GU: deferred, no CVA tenderness Musculoskeletal: FROM all extremities, warm and well perfused Neurologic:  Normal speech and language.  Skin:  Skin is warm, dry and intact. No rash noted. Psychiatric: Mood and affect are normal. Speech and behavior are normal.  ____________________________________________   LABS (all labs ordered are listed, but only abnormal results are displayed)  Labs Reviewed  URINALYSIS, COMPLETE (UACMP) WITH MICROSCOPIC - Abnormal; Notable for the following components:      Result Value   Color, Urine YELLOW (*)    APPearance CLOUDY (*)    Hgb urine dipstick MODERATE (*)    Leukocytes, UA LARGE (*)    Bacteria, UA MANY (*)  Squamous Epithelial / LPF 6-30 (*)    All other components within normal limits  URINE CULTURE  POC URINE PREG, ED  POCT PREGNANCY, URINE   ____________________________________________   ____________________________________________  RADIOLOGY    ____________________________________________   PROCEDURES  Procedure(s) performed: No  Procedures    ____________________________________________   INITIAL IMPRESSION / ASSESSMENT AND PLAN / ED COURSE  Pertinent labs & imaging results that were available during my care of the patient were reviewed by me and considered in my medical decision making (see  chart for details).  Patient is a 27 year old female presents emergency department complaining of UTI symptoms for 2 days.  She has a strong history of UTIs  On physical exam she appears well, there is no CVA tenderness her abdomen is nontender  UA shows large leuks, many bacteria  Test results were discussed with the patient.  She was given a prescription for Septra DS 1 twice daily for 7 days.  A urine culture was added to the urine due to the number of UTIs patient has had.  Explained to her that if the urine culture shows that it is resistant to the Septra that a nurse will call and call in a different antibiotic for her.  She states she understands.  She is to drink plenty of water.  She is to take Tylenol and ibuprofen as needed for pain/fever if needed.  She was discharged in stable condition     As part of my medical decision making, I reviewed the following data within the electronic MEDICAL RECORD NUMBER Nursing notes reviewed and incorporated, Labs reviewed UA positive for large leuks and bacteria, Notes from prior ED visits and [place] Controlled Substance Database  ____________________________________________   FINAL CLINICAL IMPRESSION(S) / ED DIAGNOSES  Final diagnoses:  Acute lower UTI      NEW MEDICATIONS STARTED DURING THIS VISIT:  Discharge Medication List as of 04/09/2017  5:37 PM       Note:  This document was prepared using Dragon voice recognition software and may include unintentional dictation errors.    Faythe GheeFisher, Heaven Wandell W, PA-C 04/09/17 2310    Dionne BucySiadecki, Sebastian, MD 04/10/17 [phone]

## 2017-04-09 NOTE — Discharge Instructions (Signed)
Follow-up with your regular doctor if not better in 3-5 days.  Use medication as prescribed.  Drink plenty of water to flush the infection out of your body.  Over-the-counter cranberry cocktail juice or cranberry pills will also help.  We have ordered a urine culture.  These results will not be back for 2-3 days.  If you need a different antibiotic the nurse will call you and call in a different antibiotic.

## 2017-04-09 NOTE — ED Triage Notes (Signed)
Presents with urinary freq and pain   sxs' started about 1-2 days ago

## 2017-04-11 LAB — URINE CULTURE: Culture: >=100000 — AB

## 2017-06-25 ENCOUNTER — Emergency Department (HOSPITAL_COMMUNITY)
Admission: EM | Admit: 2017-06-25 | Discharge: 2017-06-25 | Disposition: A | Discharge status: Home / Self Care | Payer: Self-pay | Attending: Emergency Medicine | Admitting: Emergency Medicine

## 2017-06-25 ENCOUNTER — Encounter (HOSPITAL_COMMUNITY): Payer: Self-pay | Admitting: Emergency Medicine

## 2017-06-25 ENCOUNTER — Other Ambulatory Visit: Payer: Self-pay

## 2017-06-25 DIAGNOSIS — N3001 Acute cystitis with hematuria: Secondary | ICD-10-CM | POA: Insufficient documentation

## 2017-06-25 DIAGNOSIS — F17210 Nicotine dependence, cigarettes, uncomplicated: Secondary | ICD-10-CM | POA: Insufficient documentation

## 2017-06-25 LAB — URINALYSIS, ROUTINE W REFLEX MICROSCOPIC
Bilirubin Urine: NEGATIVE
GLUCOSE, UA: NEGATIVE mg/dL
Ketones, ur: NEGATIVE mg/dL
Nitrite: NEGATIVE
PH: 8 (ref 5.0–8.0)
Protein, ur: NEGATIVE mg/dL
Specific Gravity, Urine: 1.003 — ABNORMAL LOW (ref 1.005–1.030)

## 2017-06-25 LAB — POC URINE PREG, ED: Preg Test, Ur: NEGATIVE

## 2017-06-25 MED ORDER — CEPHALEXIN 500 MG PO CAPS: 500.0000 mg | ORAL_CAPSULE | Freq: Three times a day (TID) | ORAL | 0 refills | Status: DC

## 2017-06-25 MED ORDER — CEPHALEXIN 500 MG PO CAPS
500.0000 mg | ORAL_CAPSULE | Freq: Once | ORAL | Status: AC
  Administered 2017-06-25: 500 mg via ORAL
  Filled 2017-06-25: qty 1

## 2017-06-25 NOTE — ED Provider Notes (Signed)
[facility]-EMERGENCY DEPT Provider Note   CSN: [id number] Arrival date & time: 06/25/17  1133     History   Chief Complaint Chief Complaint  Patient presents with  . Dysuria    HPI Kaitlin Lewis is a 27 y.o. female.  HPI  Patient is a 27 year old female presents to the emergency department with complaints of dysuria and urinary frequency over the past 3 days.  Denies fevers chills.  No nausea or vomiting.  States history of recurrent UTIs.  No vaginal discharge.  States symptoms feel like her usual UTI.  No other issues at this time.  Symptoms are moderate in severity   History reviewed. No pertinent past medical history.  There are no active problems to display for this patient.   History reviewed. No pertinent surgical history.   OB History   None      Home Medications    Prior to Admission medications   Medication Sig Start Date End Date Taking? Authorizing Provider  cephALEXin (KEFLEX) 500 MG capsule Take 1 capsule (500 mg total) by mouth 3 (three) times daily. 06/25/17   Azalia Bilisampos, Arial Galligan, MD    Family History No family history on file.  Social History Social History   Tobacco Use  . Smoking status: Current Some Day Smoker    Types: Cigarettes  . Smokeless tobacco: Never Used  Substance Use Topics  . Alcohol use: Yes    Comment: weekends  . Drug use: No     Allergies   Patient has no known allergies.   Review of Systems Review of Systems  All other systems reviewed and are negative.    Physical Exam Updated Vital Signs BP 123/76 (BP Location: Right Arm)   Pulse 77   Temp 98.2 F (36.8 C) (Oral)   Resp 16   Ht 5\' 2"  (1.575 m)   Wt 54.9 kg (121 lb)   SpO2 100%   BMI 22.13 kg/m   Physical Exam  Constitutional: She is oriented to person, place, and time. She appears well-developed and well-nourished. No distress.  HENT:  Head: Normocephalic and atraumatic.  Eyes: EOM are normal.  Neck: Normal range of motion.    Cardiovascular: Normal rate.  Pulmonary/Chest: Effort normal.  Abdominal: Soft. She exhibits no distension. There is no tenderness.  Musculoskeletal: Normal range of motion.  Neurological: She is alert and oriented to person, place, and time.  Skin: Skin is warm and dry.  Psychiatric: She has a normal mood and affect. Judgment normal.  Nursing note and vitals reviewed.    ED Treatments / Results  Labs (all labs ordered are listed, but only abnormal results are displayed) Labs Reviewed  URINALYSIS, ROUTINE W REFLEX MICROSCOPIC - Abnormal; Notable for the following components:      Result Value   Color, Urine STRAW (*)    Specific Gravity, Urine 1.003 (*)    Hgb urine dipstick SMALL (*)    Leukocytes, UA TRACE (*)    Bacteria, UA RARE (*)    All other components within normal limits  URINE CULTURE  POC URINE PREG, ED    EKG None  Radiology No results found.  Procedures Procedures (including critical care time)  Medications Ordered in ED Medications  cephALEXin (KEFLEX) capsule 500 mg (has no administration in time range)     Initial Impression / Assessment and Plan / ED Course  I have reviewed the triage vital signs and the nursing notes.  Pertinent labs & imaging results that were  available during my care of the patient were reviewed by me and considered in my medical decision making (see chart for details).    Acute cystitis.  Abdominal exam benign.  Discharged home in good condition.  Home with antibiotics.  Urine culture sent.    Final Clinical Impressions(s) / ED Diagnoses   Final diagnoses:  Acute cystitis with hematuria    ED Discharge Orders        Ordered    cephALEXin (KEFLEX) 500 MG capsule  3 times daily     06/25/17 1313       Azalia Bilis, MD 06/25/17 1316

## 2017-06-25 NOTE — ED Notes (Signed)
Pt reports that she has had pain and burning with urination and pt reports that urine has had a strong smell. Pt reports that she has left side lower abdominal pain 4/10. Pt reports that she has urinary frequency. Pt is alert and oriented x 4 and is verbally responsive.

## 2017-06-25 NOTE — ED Triage Notes (Signed)
Pt complaint of dysuria for a few days; hx of same with UTI.

## 2017-06-28 LAB — URINE CULTURE: Culture: >=100000 — AB

## 2017-06-29 ENCOUNTER — Telehealth: Payer: Self-pay

## 2017-06-29 NOTE — Telephone Encounter (Signed)
Post ED Visit - Positive Culture Follow-up  Culture report reviewed by antimicrobial stewardship pharmacist:  []  Enzo BiNathan Batchelder, Pharm.D. []  Celedonio MiyamotoJeremy Frens, Pharm.D., BCPS AQ-ID []  Garvin FilaMike Maccia, Pharm.D., BCPS []  Georgina PillionElizabeth Martin, Pharm.D., BCPS []  La FranceMinh Pham, [address] Rainbow BoulevardPharm.D., BCPS, AAHIVP []  Estella HuskMichelle Turner, Pharm.D., BCPS, AAHIVP []  Lysle Pearlachel Rumbarger, PharmD, BCPS []  Sherlynn CarbonAustin Heap, PharmD []  Pollyann SamplesAndy Johnston, PharmD, BCPS [facility] Hospital[facility] Mancheril Pharm D Positive urine culture Treated with Cephalexin, organism sensitive to the same and no further patient follow-up is required at this time.  Jerry CarasCullom, Trudi Morgenthaler Burnett 06/29/2017, 10:25 AM

## 2018-09-13 ENCOUNTER — Encounter (HOSPITAL_COMMUNITY): Payer: Self-pay | Admitting: *Deleted

## 2018-09-13 ENCOUNTER — Other Ambulatory Visit: Payer: Self-pay

## 2018-09-13 ENCOUNTER — Emergency Department (HOSPITAL_COMMUNITY)
Admission: EM | Admit: 2018-09-13 | Discharge: 2018-09-13 | Disposition: A | Discharge status: Home / Self Care | Payer: Self-pay | Attending: Emergency Medicine | Admitting: Emergency Medicine

## 2018-09-13 ENCOUNTER — Emergency Department (HOSPITAL_COMMUNITY): Payer: Self-pay

## 2018-09-13 DIAGNOSIS — M25562 Pain in left knee: Secondary | ICD-10-CM | POA: Insufficient documentation

## 2018-09-13 DIAGNOSIS — F17210 Nicotine dependence, cigarettes, uncomplicated: Secondary | ICD-10-CM | POA: Insufficient documentation
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
## 2018-09-13 NOTE — Discharge Instructions (Signed)

## 2018-09-13 NOTE — ED Notes (Signed)
Patient verbalizes understanding of discharge instructions. Opportunity for questioning and answers were provided. Pt discharged from ED. 
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
## 2019-01-28 ENCOUNTER — Other Ambulatory Visit: Payer: Self-pay

## 2019-01-28 ENCOUNTER — Emergency Department (HOSPITAL_COMMUNITY)
Admission: EM | Admit: 2019-01-28 | Discharge: 2019-01-28 | Disposition: A | Discharge status: Home / Self Care | Payer: Self-pay | Attending: Emergency Medicine | Admitting: Emergency Medicine

## 2019-01-28 ENCOUNTER — Encounter (HOSPITAL_COMMUNITY): Payer: Self-pay

## 2019-01-28 DIAGNOSIS — F17210 Nicotine dependence, cigarettes, uncomplicated: Secondary | ICD-10-CM | POA: Insufficient documentation

## 2019-01-28 DIAGNOSIS — K029 Dental caries, unspecified: Secondary | ICD-10-CM | POA: Insufficient documentation

## 2019-01-28 MED ORDER — AMOXICILLIN 500 MG PO CAPS: 500.0000 mg | ORAL_CAPSULE | Freq: Three times a day (TID) | ORAL | 0 refills | Status: DC

## 2019-01-28 NOTE — ED Triage Notes (Signed)
Onset 2 weeks left lower wisdom tooth broke, causing left upper and lower teeth pain.

## 2019-01-28 NOTE — ED Provider Notes (Signed)
[facility] EMERGENCY DEPARTMENT Provider Note   CSN: [id number] Arrival date & time: 01/28/19  1145     History Chief Complaint  Patient presents with  . Dental Pain    Kaitlin Lewis is a 29 y.o. female.  Pt comcerned about possible dental infection.  Pt complains of pain in upper and lower left posterior teeth.  Pt does not have a dentist  The history is provided by the patient. No language interpreter was used.  Dental Pain Location:  Upper and lower Quality:  Aching Severity:  Moderate Timing:  Constant Progression:  Worsening Context: dental caries   Relieved by:  Nothing Worsened by:  Nothing      History reviewed. No pertinent past medical history.  There are no problems to display for this patient.   History reviewed. No pertinent surgical history.   OB History   No obstetric history on file.     History reviewed. No pertinent family history.  Social History   Tobacco Use  . Smoking status: Current Some Day Smoker    Types: Cigarettes  . Smokeless tobacco: Never Used  Substance Use Topics  . Alcohol use: Yes    Comment: weekends  . Drug use: No    Home Medications Prior to Admission medications   Medication Sig Start Date End Date Taking? Authorizing Provider  amoxicillin (AMOXIL) 500 MG capsule Take 1 capsule (500 mg total) by mouth 3 (three) times daily. 01/28/19   Elson Areas, PA-C  cephALEXin (KEFLEX) 500 MG capsule Take 1 capsule (500 mg total) by mouth 3 (three) times daily. 06/25/17   Azalia Bilis, MD    Allergies    Patient has no known allergies.  Review of Systems   Review of Systems  All other systems reviewed and are negative.   Physical Exam Updated Vital Signs BP 117/81 (BP Location: Left Arm)   Pulse 67   Temp 98.3 F (36.8 C) (Oral)   Resp 16   LMP 01/11/2019   SpO2 100%   Physical Exam Vitals reviewed.  HENT:     Head: Normocephalic.     Nose: Nose normal.     Mouth/Throat:   Comments: No obvious abscess, dental caries  Cardiovascular:     Rate and Rhythm: Normal rate.  Pulmonary:     Effort: Pulmonary effort is normal.  Musculoskeletal:        General: Normal range of motion.  Skin:    General: Skin is warm.  Neurological:     General: No focal deficit present.     Mental Status: She is alert.  Psychiatric:        Mood and Affect: Mood normal.     ED Results / Procedures / Treatments   Labs (all labs ordered are listed, but only abnormal results are displayed) Labs Reviewed - No data to display  EKG None  Radiology No results found.  Procedures Procedures (including critical care time)  Medications Ordered in ED Medications - No data to display  ED Course  I have reviewed the triage vital signs and the nursing notes.  Pertinent labs & imaging results that were available during my care of the patient were reviewed by me and considered in my medical decision making (see chart for details).    MDM Rules/Calculators/A&P                      MDM  Pt given referral to dentist. rx for amoxicillian  Final Clinical Impression(s) / ED Diagnoses Final diagnoses:  Dental caries    Rx / DC Orders ED Discharge Orders         Ordered    amoxicillin (AMOXIL) 500 MG capsule  3 times daily     01/28/19 1311        An After Visit Summary was printed and given to the patient.    Fransico Meadow, PA-C 01/28/19 [place], DO 01/28/19 1425

## 2019-10-15 ENCOUNTER — Emergency Department (HOSPITAL_COMMUNITY): Payer: No Typology Code available for payment source

## 2019-10-15 ENCOUNTER — Encounter (HOSPITAL_COMMUNITY): Payer: Self-pay | Admitting: Pharmacy Technician

## 2019-10-15 ENCOUNTER — Emergency Department (HOSPITAL_COMMUNITY)
Admission: EM | Admit: 2019-10-15 | Discharge: 2019-10-15 | Disposition: A | Discharge status: Home / Self Care | Payer: No Typology Code available for payment source | Attending: Emergency Medicine | Admitting: Emergency Medicine

## 2019-10-15 DIAGNOSIS — T1490XA Injury, unspecified, initial encounter: Secondary | ICD-10-CM

## 2019-10-15 DIAGNOSIS — M79605 Pain in left leg: Secondary | ICD-10-CM | POA: Insufficient documentation

## 2019-10-15 DIAGNOSIS — M25512 Pain in left shoulder: Secondary | ICD-10-CM | POA: Diagnosis present

## 2019-10-15 DIAGNOSIS — Y9355 Activity, bike riding: Secondary | ICD-10-CM | POA: Insufficient documentation

## 2019-10-15 DIAGNOSIS — L089 Local infection of the skin and subcutaneous tissue, unspecified: Secondary | ICD-10-CM

## 2019-10-15 LAB — CBC
HCT: 33.7 % — ABNORMAL LOW (ref 36.0–46.0)
Hemoglobin: 10.7 g/dL — ABNORMAL LOW (ref 12.0–15.0)
MCH: 30.4 pg (ref 26.0–34.0)
MCHC: 31.8 g/dL (ref 30.0–36.0)
MCV: 95.7 fL (ref 80.0–100.0)
Platelets: 325 10*3/uL (ref 150–400)
RBC: 3.52 MIL/uL — ABNORMAL LOW (ref 3.87–5.11)
RDW: 12.9 % (ref 11.5–15.5)
WBC: 4.1 10*3/uL (ref 4.0–10.5)
nRBC: 0 % (ref 0.0–0.2)

## 2019-10-15 LAB — PROTIME-INR
INR: 1 (ref 0.8–1.2)
Prothrombin Time: 12.9 seconds (ref 11.4–15.2)

## 2019-10-15 LAB — I-STAT CHEM 8, ED
BUN: 5 mg/dL — ABNORMAL LOW (ref 6–20)
Calcium, Ion: 1.07 mmol/L — ABNORMAL LOW (ref 1.15–1.40)
Chloride: 100 mmol/L (ref 98–111)
Creatinine, Ser: 0.7 mg/dL (ref 0.44–1.00)
Glucose, Bld: 101 mg/dL — ABNORMAL HIGH (ref 70–99)
HCT: 32 % — ABNORMAL LOW (ref 36.0–46.0)
Hemoglobin: 10.9 g/dL — ABNORMAL LOW (ref 12.0–15.0)
Potassium: 3.7 mmol/L (ref 3.5–5.1)
Sodium: 135 mmol/L (ref 135–145)
TCO2: 25 mmol/L (ref 22–32)

## 2019-10-15 LAB — COMPREHENSIVE METABOLIC PANEL
ALT: 19 U/L (ref 0–44)
AST: 31 U/L (ref 15–41)
Albumin: 4 g/dL (ref 3.5–5.0)
Alkaline Phosphatase: 39 U/L (ref 38–126)
Anion gap: 12 (ref 5–15)
BUN: 6 mg/dL (ref 6–20)
CO2: 24 mmol/L (ref 22–32)
Calcium: 9.1 mg/dL (ref 8.9–10.3)
Chloride: 100 mmol/L (ref 98–111)
Creatinine, Ser: 0.64 mg/dL (ref 0.44–1.00)
GFR calc Af Amer: >60 mL/min (ref >60)
GFR calc non Af Amer: >60 mL/min (ref >60)
Glucose, Bld: 104 mg/dL — ABNORMAL HIGH (ref 70–99)
Potassium: 3.7 mmol/L (ref 3.5–5.1)
Sodium: 136 mmol/L (ref 135–145)
Total Bilirubin: 1.3 mg/dL — ABNORMAL HIGH (ref 0.3–1.2)
Total Protein: 6.6 g/dL (ref 6.5–8.1)

## 2019-10-15 LAB — I-STAT BETA HCG BLOOD, ED (MC, WL, AP ONLY): I-stat hCG, quantitative: <5 m[IU]/mL (ref <5)

## 2019-10-15 LAB — LACTIC ACID, PLASMA: Lactic Acid, Venous: 1.2 mmol/L (ref 0.5–1.9)

## 2019-10-15 LAB — ETHANOL: Alcohol, Ethyl (B): <10 mg/dL (ref <10)

## 2019-10-15 MED ORDER — CYCLOBENZAPRINE HCL 5 MG PO TABS: 5.0000 mg | ORAL_TABLET | Freq: Three times a day (TID) | ORAL | 0 refills | Status: DC | PRN

## 2019-10-15 MED ORDER — IBUPROFEN 600 MG PO TABS: 600.0000 mg | ORAL_TABLET | Freq: Four times a day (QID) | ORAL | 0 refills | Status: DC | PRN

## 2019-10-15 MED ORDER — SODIUM CHLORIDE 0.9 % IV BOLUS
1000.0000 mL | Freq: Once | INTRAVENOUS | Status: AC
  Administered 2019-10-15: 1000 mL via INTRAVENOUS

## 2019-10-15 MED ORDER — IOHEXOL 300 MG/ML  SOLN
100.0000 mL | Freq: Once | INTRAMUSCULAR | Status: AC | PRN
  Administered 2019-10-15: 100 mL via INTRAVENOUS

## 2019-10-15 MED ORDER — FENTANYL CITRATE (PF) 100 MCG/2ML IJ SOLN: 50.0000 ug | Freq: Once | INTRAMUSCULAR | Status: DC
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
## 2019-10-15 NOTE — ED Notes (Signed)
Patient verbalizes understanding of discharge instructions. Opportunity for questioning and answers were provided. Armband removed by staff, pt discharged from ED ambulatory.   

## 2019-10-16 ENCOUNTER — Encounter (HOSPITAL_COMMUNITY): Payer: Self-pay

## 2019-10-16 ENCOUNTER — Telehealth: Payer: Self-pay | Admitting: Surgery

## 2019-10-16 NOTE — Telephone Encounter (Signed)
  [facility] Medication Assistance Card Name: Reita Shindler ID (MRN): [id number] Bin: [id number] RX Group: [id number] Discharge Date: 10/16/2019 Expiration Date: 10/27/2019                                           (must be filled within 7 days of discharge)                  Dear   : Kaitlin Lewis  You have been approved to have the prescriptions written by your discharging physician filled through our [facility] (Medication Assistance Through [facility]) program. This program allows for a one-time (no refills) 34-day supply of selected medications for a low copay amount.  The copay is $3.00 per prescription. For instance, if you have one prescription, you will pay $3.00; for two prescriptions, you pay $6.00; for three prescriptions, you pay $9.00; and so on.  Only certain pharmacies are participating in this program with [facility]. You will need to select one of the pharmacies from the attached list and take your prescriptions, this letter, and your photo ID to one of the participating pharmacies.   We are excited that you are able to use the [facility] program to get your medications. These prescriptions must be filled within 7 days of hospital discharge or they will no longer be valid for the [facility] program. Should you have any problems with your prescriptions please contact your case management team member at [phone] for [place].  Thank you, [facility] Management

## 2020-04-15 ENCOUNTER — Other Ambulatory Visit: Payer: Self-pay

## 2020-04-15 ENCOUNTER — Encounter (HOSPITAL_COMMUNITY): Payer: Self-pay | Admitting: Emergency Medicine

## 2020-04-15 ENCOUNTER — Emergency Department (HOSPITAL_COMMUNITY)
Admission: EM | Admit: 2020-04-15 | Discharge: 2020-04-15 | Disposition: A | Discharge status: Home / Self Care | Payer: Self-pay | Attending: Emergency Medicine | Admitting: Emergency Medicine

## 2020-04-15 DIAGNOSIS — A5901 Trichomonal vulvovaginitis: Secondary | ICD-10-CM | POA: Insufficient documentation

## 2020-04-15 DIAGNOSIS — N390 Urinary tract infection, site not specified: Secondary | ICD-10-CM | POA: Insufficient documentation

## 2020-04-15 DIAGNOSIS — R300 Dysuria: Secondary | ICD-10-CM | POA: Insufficient documentation

## 2020-04-15 DIAGNOSIS — F17210 Nicotine dependence, cigarettes, uncomplicated: Secondary | ICD-10-CM | POA: Insufficient documentation

## 2020-04-15 DIAGNOSIS — N898 Other specified noninflammatory disorders of vagina: Secondary | ICD-10-CM

## 2020-04-15 LAB — URINALYSIS, ROUTINE W REFLEX MICROSCOPIC
Bacteria, UA: NONE SEEN
Bilirubin Urine: NEGATIVE
Glucose, UA: NEGATIVE mg/dL
Ketones, ur: NEGATIVE mg/dL
Nitrite: NEGATIVE
Protein, ur: NEGATIVE mg/dL
Specific Gravity, Urine: 1.008 (ref 1.005–1.030)
pH: 6 (ref 5.0–8.0)

## 2020-04-15 LAB — PREGNANCY, URINE: Preg Test, Ur: NEGATIVE

## 2020-04-15 LAB — WET PREP, GENITAL
Clue Cells Wet Prep HPF POC: NONE SEEN
Sperm: NONE SEEN
Yeast Wet Prep HPF POC: NONE SEEN

## 2020-04-15 MED ORDER — METRONIDAZOLE 500 MG PO TABS: 500.0000 mg | ORAL_TABLET | Freq: Three times a day (TID) | ORAL | 0 refills | Status: AC

## 2020-04-15 MED ORDER — DOXYCYCLINE HYCLATE 100 MG PO CAPS: 100.0000 mg | ORAL_CAPSULE | Freq: Two times a day (BID) | ORAL | 0 refills | Status: AC

## 2020-04-15 MED ORDER — CEFTRIAXONE SODIUM 1 G IJ SOLR
500.0000 mg | Freq: Once | INTRAMUSCULAR | Status: AC
  Administered 2020-04-15: 500 mg via INTRAMUSCULAR
  Filled 2020-04-15: qty 10

## 2020-04-15 MED ORDER — STERILE WATER FOR INJECTION IJ SOLN
INTRAMUSCULAR | Status: AC
  Filled 2020-04-15: qty 10

## 2020-04-15 MED ORDER — CEPHALEXIN 500 MG PO CAPS: 500.0000 mg | ORAL_CAPSULE | Freq: Three times a day (TID) | ORAL | 0 refills | Status: AC

## 2020-04-15 NOTE — ED Provider Notes (Signed)
Blood pressure 112/75, pulse 65, temperature 98.2 F (36.8 C), temperature source Oral, resp. rate 16, height 5\' 2"  (1.575 m), weight 54.4 kg, SpO2 99 %.  Assuming care from Dr. .  In short, Kaitlin Lewis is a 30 y.o. female with a chief complaint of Vaginal Discharge and Dysuria .  Refer to the original H&P for additional details.  The current plan of care is to f/u on wet prep and reassess.    26, MD 04/16/20 2033

## 2020-04-15 NOTE — ED Triage Notes (Signed)
Patient reports vaginal irritation with discharge and dysuria since menstrual cycle last week. Reports using Monistat without relief. Hx UTI and yeast infection.

## 2020-04-15 NOTE — ED Provider Notes (Signed)
[facility]-EMERGENCY DEPT Provider Note   CSN: [id number] Arrival date & time: 04/15/20  1937     History Chief Complaint  Patient presents with   Vaginal Discharge   Dysuria    Kaitlin Lewis is a 30 y.o. female.  HPI      30yo female presents with concern for vaginal discharge, itching, dysuria.    Reports since she had her menses over the last 3 days she has had increasing vaginal irritation, burning itching, sensation of swelling. Has had vaginal discharge, tried using generic monistat OTC for the last 2 days but feels the symptoms are getting worse. Now having burning with urination feeling similar to past UTIs. Reports these symptoms are worse than what she has had previously.  She has had some chronic mild left sided abdominal pain that is not changed and no acute abdominal or pelvic pain today. No fevers, nausea, or vomiting.  Is sexually active with her husband.  History reviewed. No pertinent past medical history.  There are no problems to display for this patient.   History reviewed. No pertinent surgical history.   OB History   No obstetric history on file.     No family history on file.  Social History   Tobacco Use   Smoking status: Current Some Day Smoker    Types: Cigarettes   Smokeless tobacco: Never Used  Substance Use Topics   Alcohol use: Yes    Comment: weekends   Drug use: No    Home Medications Prior to Admission medications   Medication Sig Start Date End Date Taking? Authorizing Provider  cephALEXin (KEFLEX) 500 MG capsule Take 1 capsule (500 mg total) by mouth 3 (three) times daily for 7 days. 04/15/20 04/22/20 Yes Alvira Monday, MD  doxycycline (VIBRAMYCIN) 100 MG capsule Take 1 capsule (100 mg total) by mouth 2 (two) times daily for 7 days. 04/15/20 04/22/20 Yes Alvira Monday, MD  metroNIDAZOLE (FLAGYL) 500 MG tablet Take 1 tablet (500 mg total) by mouth 3 (three) times daily for 7 days. 04/15/20 04/22/20 Yes  Alvira Monday, MD    Allergies    Patient has no known allergies.  Review of Systems   Review of Systems  Respiratory: Negative for shortness of breath.   Cardiovascular: Negative for chest pain.  Gastrointestinal: Negative for abdominal pain, diarrhea, nausea and vomiting.  Genitourinary: Positive for dysuria, vaginal discharge and vaginal pain. Negative for flank pain.  Skin: Negative for rash.    Physical Exam Updated Vital Signs BP (!) 125/99 (BP Location: Left Arm)    Pulse 81    Temp 98.2 F (36.8 C) (Oral)    Resp 15    Ht 5\' 2"  (1.575 m)    Wt 54.4 kg    SpO2 98%    BMI 21.95 kg/m   Physical Exam Vitals and nursing note reviewed.  Constitutional:      General: She is not in acute distress.    Appearance: Normal appearance. She is not ill-appearing, toxic-appearing or diaphoretic.  HENT:     Head: Normocephalic.  Eyes:     Conjunctiva/sclera: Conjunctivae normal.  Cardiovascular:     Rate and Rhythm: Normal rate and regular rhythm.     Pulses: Normal pulses.  Pulmonary:     Effort: Pulmonary effort is normal. No respiratory distress.  Abdominal:     General: There is no distension.     Tenderness: There is no abdominal tenderness.  Genitourinary:    Cervix: No cervical motion  tenderness or discharge.     Uterus: Not tender.      Adnexa:        Right: No tenderness.         Left: No tenderness.    Musculoskeletal:        General: No deformity or signs of injury.     Cervical back: No rigidity.  Skin:    General: Skin is warm and dry.     Coloration: Skin is not jaundiced or pale.  Neurological:     General: No focal deficit present.     Mental Status: She is alert and oriented to person, place, and time.     ED Results / Procedures / Treatments   Labs (all labs ordered are listed, but only abnormal results are displayed) Labs Reviewed  WET PREP, GENITAL - Abnormal; Notable for the following components:      Result Value   Trich, Wet Prep PRESENT  (*)    WBC, Wet Prep HPF POC MANY (*)    All other components within normal limits  URINALYSIS, ROUTINE W REFLEX MICROSCOPIC - Abnormal; Notable for the following components:   Hgb urine dipstick MODERATE (*)    Leukocytes,Ua LARGE (*)    All other components within normal limits  URINE CULTURE  PREGNANCY, URINE  GC/CHLAMYDIA PROBE AMP ([place]) NOT AT [facility]    EKG None  Radiology No results found.  Procedures Procedures   Medications Ordered in ED Medications  cefTRIAXone (ROCEPHIN) injection 500 mg (500 mg Intramuscular Given 04/15/20 2349)  sterile water (preservative free) injection (  Given 04/15/20 2349)    ED Course  I have reviewed the triage vital signs and the nursing notes.  Pertinent labs & imaging results that were available during my care of the patient were reviewed by me and considered in my medical decision making (see chart for details).    MDM Rules/Calculators/A&P                           29yo female presents with concern for vaginal discharge, itching, dysuria.  Pregnancy test negative.  No acute abdominal pain, uterine tenderness, adnexal tenderness or fever and doubt appendicitis, PID, TOA, pyelonephritis.    UA consistent with possible UTI with leukocytes and dysuria and will treat with keflex.    Wet prep significant for trichomonas. WIll treat empirically for other STI with doxycycline, rocephin in addition to flagyl for trichomonas treatment. Patient discharged in stable condition with understanding of reasons to return.    Final Clinical Impression(s) / ED Diagnoses Final diagnoses:  Dysuria  Vaginal discharge  Trichomonas vaginitis  Urinary tract infection without hematuria, site unspecified    Rx / DC Orders ED Discharge Orders         Ordered    doxycycline (VIBRAMYCIN) 100 MG capsule  2 times daily        04/15/20 2334    cephALEXin (KEFLEX) 500 MG capsule  3 times daily        04/15/20 2334    metroNIDAZOLE (FLAGYL) 500 MG  tablet  3 times daily        04/15/20 2335           Alvira Monday, MD 04/16/20 1156

## 2020-04-17 LAB — URINE CULTURE: Culture: 70000 — AB

## 2020-04-18 ENCOUNTER — Telehealth: Payer: Self-pay | Admitting: Emergency Medicine

## 2020-04-18 LAB — GC/CHLAMYDIA PROBE AMP (~~LOC~~) NOT AT ARMC
Chlamydia: NEGATIVE
Comment: NEGATIVE
Comment: NORMAL
Neisseria Gonorrhea: NEGATIVE

## 2020-04-18 NOTE — Telephone Encounter (Signed)
Post ED Visit - Positive Culture Follow-up  Culture report reviewed by antimicrobial stewardship pharmacist: [facility] []  , Pharm.D. []  Enzo Bi, Pharm.D., BCPS AQ-ID []  , Pharm.D., BCPS []  Celedonio Miyamoto, Pharm.D., BCPS []  [place], Garvin Fila.D., BCPS, AAHIVP []  , Pharm.D., BCPS, AAHIVP []  Georgina Pillion, PharmD, BCPS []  , PharmD, BCPS []  [place], PharmD, BCPS []  [address], PharmD []  , PharmD, BCPS []  Estella Husk, PharmD  Pharmacy Team []  Lysle Pearl, PharmD []  , PharmD []  Phillips Climes, PharmD []  , Rph []  Agapito Games) , PharmD []  Verlan Friends, PharmD []  , PharmD []  Mervyn Gay, PharmD []  , PharmD []  Vinnie Level, PharmD []  [facility], PharmD []  , PharmD []  Len Childs, PharmD   Positive urine culture Treated with cephalexin, organism sensitive to the same and no further patient follow-up is required at this time.  04/18/2020, 5:13 PM

## 2020-06-24 ENCOUNTER — Emergency Department (HOSPITAL_COMMUNITY): Payer: Self-pay

## 2020-06-24 ENCOUNTER — Encounter (HOSPITAL_COMMUNITY): Payer: Self-pay

## 2020-06-24 ENCOUNTER — Emergency Department (HOSPITAL_COMMUNITY)
Admission: EM | Admit: 2020-06-24 | Discharge: 2020-06-24 | Disposition: A | Discharge status: Home / Self Care | Payer: Self-pay | Attending: Emergency Medicine | Admitting: Emergency Medicine

## 2020-06-24 ENCOUNTER — Other Ambulatory Visit: Payer: Self-pay

## 2020-06-24 DIAGNOSIS — S60221A Contusion of right hand, initial encounter: Secondary | ICD-10-CM | POA: Insufficient documentation

## 2020-06-24 DIAGNOSIS — M542 Cervicalgia: Secondary | ICD-10-CM | POA: Insufficient documentation

## 2020-06-24 DIAGNOSIS — T07XXXA Unspecified multiple injuries, initial encounter: Secondary | ICD-10-CM

## 2020-06-24 DIAGNOSIS — F17210 Nicotine dependence, cigarettes, uncomplicated: Secondary | ICD-10-CM | POA: Insufficient documentation

## 2020-06-24 DIAGNOSIS — S40811A Abrasion of right upper arm, initial encounter: Secondary | ICD-10-CM | POA: Insufficient documentation

## 2020-06-24 DIAGNOSIS — S60222A Contusion of left hand, initial encounter: Secondary | ICD-10-CM | POA: Insufficient documentation

## 2020-06-24 DIAGNOSIS — R0781 Pleurodynia: Secondary | ICD-10-CM | POA: Insufficient documentation

## 2020-06-24 DIAGNOSIS — M546 Pain in thoracic spine: Secondary | ICD-10-CM | POA: Insufficient documentation

## 2020-06-24 DIAGNOSIS — Z23 Encounter for immunization: Secondary | ICD-10-CM | POA: Insufficient documentation

## 2020-06-24 DIAGNOSIS — S0101XA Laceration without foreign body of scalp, initial encounter: Secondary | ICD-10-CM | POA: Insufficient documentation

## 2020-06-24 DIAGNOSIS — S40812A Abrasion of left upper arm, initial encounter: Secondary | ICD-10-CM | POA: Insufficient documentation

## 2020-06-24 DIAGNOSIS — T1490XA Injury, unspecified, initial encounter: Secondary | ICD-10-CM

## 2020-06-24 HISTORY — DX: Migraine, unspecified, not intractable, without status migrainosus: G43.909

## 2020-06-24 LAB — I-STAT BETA HCG BLOOD, ED (MC, WL, AP ONLY): I-stat hCG, quantitative: <5 m[IU]/mL (ref <5)

## 2020-06-24 MED ORDER — TETANUS-DIPHTH-ACELL PERTUSSIS 5-2.5-18.5 LF-MCG/0.5 IM SUSY
0.5000 mL | PREFILLED_SYRINGE | Freq: Once | INTRAMUSCULAR | Status: AC
  Administered 2020-06-24: 0.5 mL via INTRAMUSCULAR
  Filled 2020-06-24: qty 0.5

## 2020-06-24 MED ORDER — OXYCODONE-ACETAMINOPHEN 5-325 MG PO TABS: 1.0000 | ORAL_TABLET | Freq: Four times a day (QID) | ORAL | 0 refills | Status: DC | PRN

## 2020-06-24 MED ORDER — OXYCODONE-ACETAMINOPHEN 5-325 MG PO TABS
1.0000 | ORAL_TABLET | Freq: Once | ORAL | Status: AC
  Administered 2020-06-24: 1 via ORAL
  Filled 2020-06-24: qty 1

## 2020-06-24 MED ORDER — LIDOCAINE-EPINEPHRINE (PF) 2 %-1:200000 IJ SOLN
10.0000 mL | Freq: Once | INTRAMUSCULAR | Status: DC
  Filled 2020-06-24: qty 20

## 2020-06-24 NOTE — Discharge Instructions (Signed)
Staples out in 7 to 10 days 

## 2020-06-24 NOTE — ED Notes (Signed)
Head wounds stabled via Dr. Freida Busman, pt tolerated well

## 2020-06-24 NOTE — ED Notes (Signed)
In shower, no distress
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
## 2020-06-24 NOTE — ED Notes (Signed)
Patient transported to X-ray 

## 2020-06-24 NOTE — ED Triage Notes (Signed)
See chief complaint 
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
## 2020-07-09 ENCOUNTER — Other Ambulatory Visit: Payer: Self-pay

## 2020-07-09 ENCOUNTER — Encounter (HOSPITAL_COMMUNITY): Payer: Self-pay | Admitting: *Deleted

## 2020-07-09 ENCOUNTER — Emergency Department (HOSPITAL_COMMUNITY)
Admission: EM | Admit: 2020-07-09 | Discharge: 2020-07-09 | Disposition: A | Discharge status: Home / Self Care | Payer: Self-pay | Attending: Emergency Medicine | Admitting: Emergency Medicine

## 2020-07-09 DIAGNOSIS — S0101XD Laceration without foreign body of scalp, subsequent encounter: Secondary | ICD-10-CM | POA: Insufficient documentation

## 2020-07-09 DIAGNOSIS — F17210 Nicotine dependence, cigarettes, uncomplicated: Secondary | ICD-10-CM | POA: Insufficient documentation

## 2020-07-09 DIAGNOSIS — Z4802 Encounter for removal of sutures: Secondary | ICD-10-CM | POA: Insufficient documentation
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
## 2020-07-09 NOTE — Discharge Instructions (Addendum)
Watch for signs of infection which would be fever, chills, pus draining from wounds. If you develop those symptoms return to ER.
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
## 2020-10-24 ENCOUNTER — Emergency Department (HOSPITAL_COMMUNITY)
Admission: EM | Admit: 2020-10-24 | Discharge: 2020-10-24 | Disposition: A | Discharge status: Home / Self Care | Payer: Self-pay | Attending: Emergency Medicine | Admitting: Emergency Medicine

## 2020-10-24 ENCOUNTER — Encounter (HOSPITAL_COMMUNITY): Payer: Self-pay | Admitting: Emergency Medicine

## 2020-10-24 DIAGNOSIS — B9689 Other specified bacterial agents as the cause of diseases classified elsewhere: Secondary | ICD-10-CM | POA: Insufficient documentation

## 2020-10-24 DIAGNOSIS — N760 Acute vaginitis: Secondary | ICD-10-CM

## 2020-10-24 DIAGNOSIS — F17210 Nicotine dependence, cigarettes, uncomplicated: Secondary | ICD-10-CM | POA: Insufficient documentation

## 2020-10-24 LAB — URINALYSIS, ROUTINE W REFLEX MICROSCOPIC
Bilirubin Urine: NEGATIVE
Glucose, UA: NEGATIVE mg/dL
Hgb urine dipstick: NEGATIVE
Ketones, ur: NEGATIVE mg/dL
Nitrite: NEGATIVE
Protein, ur: NEGATIVE mg/dL
Specific Gravity, Urine: 1.025 (ref 1.005–1.030)
pH: 7 (ref 5.0–8.0)

## 2020-10-24 LAB — WET PREP, GENITAL
Clue Cells Wet Prep HPF POC: NONE SEEN
Sperm: NONE SEEN
Trich, Wet Prep: NONE SEEN
Yeast Wet Prep HPF POC: NONE SEEN

## 2020-10-24 LAB — HIV ANTIBODY (ROUTINE TESTING W REFLEX): HIV Screen 4th Generation wRfx: NONREACTIVE

## 2020-10-24 LAB — POC URINE PREG, ED: Preg Test, Ur: NEGATIVE

## 2020-10-24 MED ORDER — LIDOCAINE HCL 1 % IJ SOLN
INTRAMUSCULAR | Status: AC
  Filled 2020-10-24: qty 20

## 2020-10-24 MED ORDER — CEFTRIAXONE SODIUM 1 G IJ SOLR
500.0000 mg | Freq: Once | INTRAMUSCULAR | Status: AC
  Administered 2020-10-24: 500 mg via INTRAMUSCULAR
  Filled 2020-10-24: qty 10

## 2020-10-24 MED ORDER — DOXYCYCLINE HYCLATE 100 MG PO CAPS: 100.0000 mg | ORAL_CAPSULE | Freq: Two times a day (BID) | ORAL | 0 refills | Status: DC

## 2020-10-24 MED ORDER — FLUCONAZOLE 200 MG PO TABS: 200.0000 mg | ORAL_TABLET | Freq: Every day | ORAL | 0 refills | Status: AC

## 2020-10-24 NOTE — ED Provider Notes (Signed)
[facility]-EMERGENCY DEPT Provider Note   CSN: [id number] Arrival date & time: 10/24/20  1308     History Chief Complaint  Patient presents with   Dysuria    Kaitlin Lewis is a 30 y.o. female.  The history is provided by the patient.  Dysuria Pain quality:  Burning and aching Pain severity:  Moderate Onset quality:  Gradual Duration:  2 weeks Timing:  Constant Progression:  Worsening Chronicity:  Recurrent Recent urinary tract infections: no   Relieved by:  Nothing Exacerbated by: worsened by monistat. Urinary symptoms: frequent urination and hesitancy   Urinary symptoms: no hematuria and no bladder incontinence   Urinary symptoms comment:  Itching in the vaginal area and with urinating Associated symptoms: vaginal discharge   Associated symptoms: no abdominal pain, no fever, no flank pain, no genital lesions, no nausea and no vomiting   Associated symptoms comment:  Intermittent discharged which is no different than normal.  2 sexual partners in the last 2 month but does not use protection. Risk factors comment:  Trichomonas in may and took abx and it did resolve.     Past Medical History:  Diagnosis Date   Migraine     There are no problems to display for this patient.   History reviewed. No pertinent surgical history.   OB History   No obstetric history on file.     No family history on file.  Social History   Tobacco Use   Smoking status: Some Days    Types: Cigarettes   Smokeless tobacco: Never  Substance Use Topics   Alcohol use: Yes    Comment: weekends   Drug use: No    Home Medications Prior to Admission medications   Medication Sig Start Date End Date Taking? Authorizing Provider  doxycycline (VIBRAMYCIN) 100 MG capsule Take 1 capsule (100 mg total) by mouth 2 (two) times daily. 10/24/20  Yes Linkon Siverson, Alphonzo Lemmings, MD  fluconazole (DIFLUCAN) 200 MG tablet Take 1 tablet (200 mg total) by mouth daily for 3 days. 10/24/20  10/27/20 Yes Brinna Divelbiss, Alphonzo Lemmings, MD  oxyCODONE-acetaminophen (PERCOCET/ROXICET) 5-325 MG tablet Take 1-2 tablets by mouth every 6 (six) hours as needed for severe pain. 06/24/20   Lorre Nick, MD    Allergies    Patient has no known allergies.  Review of Systems   Review of Systems  Constitutional:  Negative for fever.  Gastrointestinal:  Negative for abdominal pain, nausea and vomiting.  Genitourinary:  Positive for dysuria and vaginal discharge. Negative for flank pain.  All other systems reviewed and are negative.  Physical Exam Updated Vital Signs BP 119/89 (BP Location: Left Arm)   Pulse 80   Temp 97.6 F (36.4 C) (Oral)   Resp 18   SpO2 97%   Physical Exam Vitals and nursing note reviewed. Exam conducted with a chaperone present.  Constitutional:      General: She is not in acute distress.    Appearance: Normal appearance. She is well-developed.  HENT:     Head: Normocephalic and atraumatic.  Eyes:     Pupils: Pupils are equal, round, and reactive to light.  Cardiovascular:     Rate and Rhythm: Normal rate and regular rhythm.     Heart sounds: Normal heart sounds. No murmur heard.   No friction rub.  Pulmonary:     Effort: Pulmonary effort is normal.     Breath sounds: Normal breath sounds. No wheezing or rales.  Abdominal:     General: Bowel sounds  are normal. There is no distension.     Palpations: Abdomen is soft.     Tenderness: There is no abdominal tenderness. There is no right CVA tenderness, left CVA tenderness, guarding or rebound.  Genitourinary:    Exam position: Lithotomy position.     Labia:        Right: No rash, tenderness or lesion.        Left: No rash, tenderness or lesion.      Vagina: Vaginal discharge present. No tenderness.     Cervix: Discharge present. No cervical motion tenderness.     Uterus: Normal.      Adnexa: Right adnexa normal and left adnexa normal.       Right: No tenderness or fullness.         Left: No tenderness or  fullness.       Comments: Thin white vaginal discharge Musculoskeletal:        General: No tenderness. Normal range of motion.     Right lower leg: No edema.     Left lower leg: No edema.     Comments: No edema  Skin:    General: Skin is warm and dry.     Findings: No rash.  Neurological:     Mental Status: She is alert and oriented to person, place, and time. Mental status is at baseline.     Cranial Nerves: No cranial nerve deficit.  Psychiatric:        Mood and Affect: Mood normal.        Behavior: Behavior normal.    ED Results / Procedures / Treatments   Labs (all labs ordered are listed, but only abnormal results are displayed) Labs Reviewed  WET PREP, GENITAL - Abnormal; Notable for the following components:      Result Value   WBC, Wet Prep HPF POC FEW (*)    All other components within normal limits  URINALYSIS, ROUTINE W REFLEX MICROSCOPIC - Abnormal; Notable for the following components:   APPearance HAZY (*)    Leukocytes,Ua SMALL (*)    Bacteria, UA RARE (*)    All other components within normal limits  RPR  HIV ANTIBODY (ROUTINE TESTING W REFLEX)  POC URINE PREG, ED  GC/CHLAMYDIA PROBE AMP ([place]) NOT AT [facility]    EKG None  Radiology No results found.  Procedures Procedures   Medications Ordered in ED Medications  cefTRIAXone (ROCEPHIN) injection 500 mg (500 mg Intramuscular Given 10/24/20 1648)  lidocaine (XYLOCAINE) 1 % (with pres) injection (  Given 10/24/20 1652)    ED Course  I have reviewed the triage vital signs and the nursing notes.  Pertinent labs & imaging results that were available during my care of the patient were reviewed by me and considered in my medical decision making (see chart for details).    MDM Rules/Calculators/A&P                           Patient is a 30 year old female presenting today with ongoing vaginal itching, mild dysuria and urinary hesitancy for the last 2 weeks.  She does have a history of having  trichomonas in March but received treatment and his symptoms had resolved.  She has currently had 2 sexual partners in the last month.  On exam patient has no abdominal tenderness, flank tenderness concerning for pyelonephritis or UTI.  She does have thin white vaginal discharge but no lesions present.  Wet prep, GC chlamydia,  HIV, RPR, UA are all pending.  We will treat patient for STIs with Rocephin and doxycycline.  5:36 PM Patient's wet prep is negative for trichomonas.  Negative for yeast and bacterial vaginosis.  UA is contaminated but no significant findings consistent with UTI.  Will treat with Diflucan and doxycycline.  MDM   Amount and/or Complexity of Data Reviewed Clinical lab tests: ordered and reviewed Independent visualization of images, tracings, or specimens: yes     Final Clinical Impression(s) / ED Diagnoses Final diagnoses:  Acute vaginitis    Rx / DC Orders ED Discharge Orders          Ordered    fluconazole (DIFLUCAN) 200 MG tablet  Daily        10/24/20 1735    doxycycline (VIBRAMYCIN) 100 MG capsule  2 times daily        10/24/20 1735             Gwyneth Sprout, MD 10/24/20 1736

## 2020-10-24 NOTE — Discharge Instructions (Addendum)
The results for the HIV, syphilis, gonorrhea and chlamydia should return tomorrow.  If you are negative for chlamydia you do not have to start the doxycycline.  The Diflucan should help if this is yeast and you can continue to use Monistat.  Do not use any other feminine products.  Avoid any type of sexual encounter until your symptoms fully resolve.  If you do happen to be positive for gonorrhea or chlamydia here partner needs treatment.

## 2020-10-24 NOTE — ED Provider Notes (Signed)
Emergency Medicine Provider Triage Evaluation Note  Kaitlin Lewis , a 30 y.o. female  was evaluated in triage.  Pt complains of dysuria and vaginal itching.  Was treated for trichomonas and prophylactically for gonorrhea and chlamydia in March, since then has had return of similar symptoms with some intermittent burning with urination but primarily vaginal itching that has become increasingly uncomfortable.  Has tried over-the-counter Monistat without resolution.  Could not get an appointment with the health department.  Her partner was never treated for trichomonas after she was treated.  Review of Systems  Positive: Vaginal itching, dysuria Negative: Discharge, vaginal bleeding, urinary frequency, flank pain, abdominal pain  Physical Exam  BP 120/89 (BP Location: Left Arm)   Pulse 74   Temp 97.6 F (36.4 C) (Oral)   Resp 16   SpO2 100%  Gen:   Awake, no distress   Resp:  Normal effort  MSK:   Moves extremities without difficulty  Other:    Medical Decision Making  Medically screening exam initiated at 1:56 PM.  Appropriate orders placed.  ERANDY MCEACHERN was informed that the remainder of the evaluation will be completed by another provider, this initial triage assessment does not replace that evaluation, and the importance of remaining in the ED until their evaluation is complete.     Dartha Lodge, PA-C 10/24/20 1404    Lorre Nick, MD 10/26/20 [phone]

## 2020-10-24 NOTE — ED Triage Notes (Signed)
Per pt, states she thinks she has a UTI and possibly a yeast infection-painful urination and frequency-has been recently treated for STD

## 2020-10-25 LAB — GC/CHLAMYDIA PROBE AMP (~~LOC~~) NOT AT ARMC
Chlamydia: NEGATIVE
Comment: NEGATIVE
Comment: NORMAL
Neisseria Gonorrhea: NEGATIVE

## 2020-10-25 LAB — RPR: RPR Ser Ql: NONREACTIVE

## 2021-06-16 ENCOUNTER — Other Ambulatory Visit: Payer: Self-pay

## 2021-06-16 ENCOUNTER — Emergency Department (HOSPITAL_COMMUNITY)
Admission: EM | Admit: 2021-06-16 | Discharge: 2021-06-16 | Disposition: A | Discharge status: Home / Self Care | Payer: Self-pay | Attending: Emergency Medicine | Admitting: Emergency Medicine

## 2021-06-16 ENCOUNTER — Encounter (HOSPITAL_COMMUNITY): Payer: Self-pay | Admitting: *Deleted

## 2021-06-16 DIAGNOSIS — K029 Dental caries, unspecified: Secondary | ICD-10-CM | POA: Insufficient documentation

## 2021-06-16 MED ORDER — AMOXICILLIN-POT CLAVULANATE 875-125 MG PO TABS: 1.0000 | ORAL_TABLET | Freq: Two times a day (BID) | ORAL | 0 refills | Status: DC

## 2021-06-16 NOTE — Discharge Instructions (Addendum)
Please use Tylenol or ibuprofen for pain.  You may use 600 mg ibuprofen every 6 hours or 1000 mg of Tylenol every 6 hours.  You may choose to alternate between the 2.  This would be most effective.  Not to exceed 4 g of Tylenol within 24 hours.  Not to exceed 3200 mg ibuprofen 24 hours.  Please see a dentist as soon as you can. If you have trouble picking up this antibiotic I recommend you use [email or web address].

## 2021-06-16 NOTE — ED Triage Notes (Signed)
The pt has had a toothache for months she has broken teeth but for the past 3-4 days she has had swelling in her lt face  she apparently has a mouth full of cavities  lmp last month

## 2021-06-16 NOTE — ED Provider Notes (Signed)
[facility] EMERGENCY DEPARTMENT Provider Note   CSN: [id number] Arrival date & time: 06/16/21  1618     History  Chief Complaint  Patient presents with   Dental Pain    Kaitlin Lewis is a 31 y.o. female with past medical history significant for known dental infection, dental caries who presents with concern for left-sided tooth pain, broken tooth, and left jaw swelling.  Patient was informed that she was at risk of developing a bloodstream infection and told to come in by her family.  She denies fever, but does report some feeling of chills, hot sensation on the left side of jaw.  She has been taking Tylenol, Excedrin with some relief of pain.  She reports she has no insurance and so is difficult to find a dentist.  Dental Pain     Home Medications Prior to Admission medications   Medication Sig Start Date End Date Taking? Authorizing Provider  amoxicillin-clavulanate (AUGMENTIN) 875-125 MG tablet Take 1 tablet by mouth every 12 (twelve) hours. 06/16/21  Yes Dj Senteno H, PA-C  oxyCODONE-acetaminophen (PERCOCET/ROXICET) 5-325 MG tablet Take 1-2 tablets by mouth every 6 (six) hours as needed for severe pain. 06/24/20   Lacretia Leigh, MD      Allergies    Patient has no known allergies.    Review of Systems   Review of Systems  HENT:  Positive for dental problem.   All other systems reviewed and are negative.  Physical Exam Updated Vital Signs BP 108/66 (BP Location: Right Arm)   Pulse 99   Temp 98.1 F (36.7 C) (Oral)   Resp 20   Ht 5\' 2"  (1.575 m)   Wt 55.3 kg   LMP 05/17/2021   SpO2 99%   BMI 22.30 kg/m  Physical Exam Vitals and nursing note reviewed.  Constitutional:      General: She is not in acute distress.    Appearance: Normal appearance.  HENT:     Head: Normocephalic and atraumatic.     Comments:  localized soft tissue swelling without Fluctuance notedon the left jaw    Mouth/Throat:     Comments: Patient has broken wisdom  tooth on the lower left.  There is some erythema of surrounding gums without abscess formation. Eyes:     General:        Right eye: No discharge.        Left eye: No discharge.  Cardiovascular:     Rate and Rhythm: Normal rate and regular rhythm.  Pulmonary:     Effort: Pulmonary effort is normal. No respiratory distress.  Musculoskeletal:        General: No deformity.  Skin:    General: Skin is warm and dry.  Neurological:     Mental Status: She is alert and oriented to person, place, and time.  Psychiatric:        Mood and Affect: Mood normal.        Behavior: Behavior normal.    ED Results / Procedures / Treatments   Labs (all labs ordered are listed, but only abnormal results are displayed) Labs Reviewed - No data to display  EKG None  Radiology No results found.  Procedures Procedures    Medications Ordered in ED Medications - No data to display  ED Course/ Medical Decision Making/ A&P                           Medical Decision Making  Risk Prescription drug management.   This is an overall well-appearing female who presents with concern for dental pain/infection.  I see no signs of floor of mouth swelling suggestive of Ludwig angina.  She has no evidence of dental abscess, epiglottitis.  Her airway is patent.  She is breathing without difficulty.  She has stable vital signs in triage.  Discussed that she will need further dental care for removal of her broken tooth and fixing of her dental caries.  At this time with the localized swelling and redness I have suspicion for early dental infection, we will cover patient with Augmentin.  Encouraged ibuprofen, Tylenol for pain.  Patient discharged stable condition at this time, extensive return precautions given. Final Clinical Impression(s) / ED Diagnoses Final diagnoses:  Pain due to dental caries    Rx / DC Orders ED Discharge Orders          Ordered    amoxicillin-clavulanate (AUGMENTIN) 875-125 MG tablet   Every 12 hours        06/16/21 1700              Beverely Suen, Crocker, PA-C 06/16/21 1707    Lacretia Leigh, MD 06/20/21 [phone]
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
## 2022-05-14 ENCOUNTER — Encounter (HOSPITAL_COMMUNITY): Payer: Self-pay

## 2022-05-14 ENCOUNTER — Other Ambulatory Visit: Payer: Self-pay

## 2022-05-14 ENCOUNTER — Emergency Department (HOSPITAL_COMMUNITY)
Admission: EM | Admit: 2022-05-14 | Discharge: 2022-05-14 | Disposition: A | Discharge status: Home / Self Care | Payer: Self-pay | Attending: Emergency Medicine | Admitting: Emergency Medicine

## 2022-05-14 DIAGNOSIS — M5441 Lumbago with sciatica, right side: Secondary | ICD-10-CM | POA: Insufficient documentation

## 2022-05-14 DIAGNOSIS — M5442 Lumbago with sciatica, left side: Secondary | ICD-10-CM | POA: Insufficient documentation

## 2022-05-14 LAB — I-STAT BETA HCG BLOOD, ED (MC, WL, AP ONLY): I-stat hCG, quantitative: <5 m[IU]/mL (ref <5)

## 2022-05-14 MED ORDER — LIDOCAINE 5 % EX PTCH: 1.0000 | MEDICATED_PATCH | CUTANEOUS | 0 refills | Status: AC

## 2022-05-14 MED ORDER — MORPHINE SULFATE (PF) 2 MG/ML IV SOLN
2.0000 mg | Freq: Once | INTRAVENOUS | Status: AC
  Administered 2022-05-14: 2 mg via SUBCUTANEOUS
  Filled 2022-05-14: qty 1

## 2022-05-14 MED ORDER — CYCLOBENZAPRINE HCL 10 MG PO TABS
10.0000 mg | ORAL_TABLET | Freq: Once | ORAL | Status: AC
  Administered 2022-05-14: 10 mg via ORAL
  Filled 2022-05-14: qty 1

## 2022-05-14 MED ORDER — KETOROLAC TROMETHAMINE 15 MG/ML IJ SOLN
15.0000 mg | Freq: Once | INTRAMUSCULAR | Status: AC
  Administered 2022-05-14: 15 mg via INTRAMUSCULAR
  Filled 2022-05-14: qty 1

## 2022-05-14 MED ORDER — IBUPROFEN 600 MG PO TABS: 600.0000 mg | ORAL_TABLET | Freq: Three times a day (TID) | ORAL | 0 refills | Status: AC | PRN

## 2022-05-14 MED ORDER — CYCLOBENZAPRINE HCL 10 MG PO TABS: 10.0000 mg | ORAL_TABLET | Freq: Three times a day (TID) | ORAL | 0 refills | Status: AC | PRN

## 2022-05-14 MED ORDER — DEXAMETHASONE SODIUM PHOSPHATE 10 MG/ML IJ SOLN
10.0000 mg | Freq: Once | INTRAMUSCULAR | Status: AC
  Administered 2022-05-14: 10 mg via INTRAMUSCULAR
  Filled 2022-05-14: qty 1

## 2022-05-14 MED ORDER — OXYCODONE HCL 5 MG PO TABS
10.0000 mg | ORAL_TABLET | Freq: Once | ORAL | Status: AC
  Administered 2022-05-14: 10 mg via ORAL
  Filled 2022-05-14: qty 2

## 2022-05-14 MED ORDER — LIDOCAINE 5 % EX PTCH
1.0000 | MEDICATED_PATCH | CUTANEOUS | Status: DC
  Administered 2022-05-14: 1 via TRANSDERMAL
  Filled 2022-05-14: qty 1

## 2022-05-14 NOTE — ED Triage Notes (Addendum)
Patient BIB [facility] from home. Has lower back pain that radiates to her right leg x1 week. Pain travels down right leg/foot. Right leg feels tingly and numb. Went to pick up her TV and her pain began crushing her today.

## 2022-05-14 NOTE — Discharge Instructions (Signed)
Thank you for letting us take care of you today.  We treated your pain with multiple medications in the ED. You should not drive for 8 hours after receiving these medications. I am prescribing muscle relaxers, NSAIDs, and lidocaine patches at home to help with pain. You may also take over the counter Tylenol on top of this. You may take up to  every 6 hours. Do not take more than  Tylenol in 24 hours.  I do believe you would benefit from a primary care provider and have provided 2 clinics you  may contact to arrange this follow up or you may go to a PCP of your own choosing. You may need further imaging such as an MRI or referral to physical therapy to prevent your sciatica from recurring or worsening.  It is important to stay active to prevent further tightening up of your muscles which can lead to prolonged and worsened pain. I provided exercises that can help with sciatica for you to do at home as tolerated.  For any new or worsening symptoms such as loss of bowel or bladder control, weakness in the legs, severe abdominal pain, new injury or trauma to the back, or other new, concerning symptoms, please return to nearest emergency department for re-evaluation.

## 2022-05-14 NOTE — Progress Notes (Signed)
TOC consulted for transportation needs. Informed RN and PA pt may receive bus pass to return home. No further TOC needs at this time.

## 2022-05-14 NOTE — ED Provider Notes (Signed)
[place] EMERGENCY DEPARTMENT AT [facility] Provider Note   CSN: [id number] Arrival date & time: 05/14/22  1127     History  Chief Complaint  Patient presents with   Back Pain    Kaitlin Lewis is a 32 y.o. female with PMH migraines who presents to ED c/o lower back pain x 1 week. States pain radiates down right lateral lower extremity with paresthesias. Pain began to worsen after lifting TV today. She denies bowel or bladder dysfunction, dysuria, hematuria, abdominal pain, fever, saddle anesthesia, chest pain, SOB, or other complaints at this time. No history of IV drug use or malignancy. History of occasional similar pain before but never this prolonged or severe. Pt took 3 Tylenol at home today without relief.       Home Medications No daily medications  Allergies    Patient has no known allergies.    Review of Systems   Review of Systems  All other systems reviewed and are negative.   Physical Exam Updated Vital Signs BP 124/78 (BP Location: Left Arm)   Pulse 92   Temp 98.4 F (36.9 C) (Oral)   Resp 18   Ht  (1.575 m)   Wt 56.7 kg   SpO2 100%   BMI 22.86 kg/m  Physical Exam Vitals and nursing note reviewed.  Constitutional:      General: She is not in acute distress.    Appearance: Normal appearance. She is not ill-appearing, toxic-appearing or diaphoretic.  HENT:     Head: Normocephalic and atraumatic.     Mouth/Throat:     Mouth: Mucous membranes are moist.  Eyes:     General: No scleral icterus.    Extraocular Movements: Extraocular movements intact.     Conjunctiva/sclera: Conjunctivae normal.  Cardiovascular:     Rate and Rhythm: Normal rate and regular rhythm.     Heart sounds: No murmur heard. Pulmonary:     Effort: Pulmonary effort is normal.     Breath sounds: Normal breath sounds.  Abdominal:     General: Abdomen is flat. There is no distension.     Palpations: Abdomen is soft.     Tenderness: There is no abdominal  tenderness. There is no right CVA tenderness, left CVA tenderness, guarding or rebound.  Musculoskeletal:     Cervical back: Normal range of motion and neck supple. No rigidity or tenderness.     Right lower leg: No edema.     Left lower leg: No edema.     Comments: No midline CTL spinal tenderness, stepoffs, or deformities, severe tenderness over paraspinal muscles on right to lumbar region with spasming, 5/5 strength in bilateral UE and LE, normal sensation  Skin:    General: Skin is warm and dry.     Capillary Refill: Capillary refill takes less than 2 seconds.  Neurological:     Mental Status: She is alert and oriented to person, place, and time.     GCS: GCS eye subscore is 4. GCS verbal subscore is 5. GCS motor subscore is 6.     Cranial Nerves: Cranial nerves 2-12 are intact. No cranial nerve deficit, dysarthria or facial asymmetry.     Sensory: Sensation is intact.     Motor: Motor function is intact. No weakness, tremor, atrophy, abnormal muscle tone or seizure activity.  Psychiatric:        Behavior: Behavior normal.     ED Results / Procedures / Treatments   Labs (all labs ordered are  listed, but only abnormal results are displayed) Labs Reviewed  I-STAT BETA HCG BLOOD, ED ([facility], [facility], [facility] ONLY)    EKG None  Radiology No results found.  Procedures Procedures    Medications Ordered in ED Medications  lidocaine (LIDODERM) 5 % 1 patch (1 patch Transdermal Patch Applied 05/14/22 1253)  morphine (PF) 2 MG/ML injection 2 mg (has no administration in time range)  ketorolac (TORADOL) 15 MG/ML injection 15 mg (15 mg Intramuscular Given 05/14/22 1253)  dexamethasone (DECADRON) injection 10 mg (10 mg Intramuscular Given 05/14/22 1252)  oxyCODONE (Oxy IR/ROXICODONE) immediate release tablet 10 mg (10 mg Oral Given 05/14/22 1246)  cyclobenzaprine (FLEXERIL) tablet 10 mg (10 mg Oral Given 05/14/22 1254)    ED Course/ Medical Decision Making/ A&P                              Medical Decision Making Risk Prescription drug management.   Medical Decision Making:   Kaitlin Lewis is a 32 y.o. female who presented to the ED today with back pain detailed above.    Patient's presentation is complicated by their history of recurrent back pain.  Complete initial physical exam performed, notably the patient  was in no acute distress.  Tenderness to right paraspinals. No midline CTL spine tenderness. No deformities. Neurologically intact.  Reviewed and confirmed nursing documentation for past medical history, family history, social history.    Initial Assessment:   With the patient's presentation of back pain, the emergent differential diagnosis for back pain includes but is not limited to fracture, muscle strain, cauda equina, spinal stenosis, DDD, ankylosing spondylitis, acute ligamentous injury, disk herniation, spondylolisthesis, epidural compression syndrome, metastatic cancer, transverse myelitis, vertebral osteomyelitis, diskitis, kidney stone, pyelonephritis, AAA, Perforated ulcer, retrocecal appendicitis, pancreatitis, bowel obstruction, retroperitoneal hemorrhage or mass, meningitis.   Initial Plan:  Symptomatic management Objective evaluation as reviewed   Final Assessment and Plan:   Patient presents to ED c/o back pain. No red flag symptoms. No history of malignancy or IV drug use. No severe trauma to the back.  On exam, no midline tenderness.  No meningismus.  Tenderness over the right paraspinals.  Neurologically intact.  Patient with history of similar recurring pain.  No current primary care follow-up.  Multiple medications ordered as above for symptomatic relief.  Patient did have good improvement following initial round of medications but still some significant pain on the right lower back.  Additional dose of morphine ordered for pain control.  Will discharge home on muscle relaxers, NSAIDs and with primary care follow-up for further management.  Patient  aware of the importance of close primary care follow-up for further management, possible imaging, and likely referral to PT.  Provided with exercises to do at home as tolerated while waiting follow-up.  Strict ED return precautions given, all questions answered, and stable for discharge.   Clinical Impression:  1. Acute bilateral low back pain with bilateral sciatica      Discharge           Final Clinical Impression(s) / ED Diagnoses Final diagnoses:  Acute bilateral low back pain with bilateral sciatica    Rx / DC Orders ED Discharge Orders          Ordered    cyclobenzaprine (FLEXERIL) 10 MG tablet  3 times daily PRN        05/14/22 1427    ibuprofen (ADVIL) 600 MG tablet  Every 8 hours PRN  05/14/22 1427    lidocaine (LIDODERM) 5 %  Every 24 hours        05/14/22 1427              Richardson Dopp 05/14/22 1434    Wynetta Fines, MD 05/14/22 [phone]

## 2022-09-17 ENCOUNTER — Emergency Department (HOSPITAL_COMMUNITY): Payer: MEDICAID

## 2022-09-17 ENCOUNTER — Inpatient Hospital Stay (HOSPITAL_COMMUNITY)
Admission: EM | Admit: 2022-09-17 | Discharge: 2022-09-20 | DRG: 872 | Disposition: A | Discharge status: Home / Self Care | Payer: MEDICAID | Attending: Internal Medicine | Admitting: Internal Medicine

## 2022-09-17 ENCOUNTER — Encounter (HOSPITAL_COMMUNITY): Payer: Self-pay | Admitting: Emergency Medicine

## 2022-09-17 ENCOUNTER — Other Ambulatory Visit: Payer: Self-pay

## 2022-09-17 DIAGNOSIS — E876 Hypokalemia: Secondary | ICD-10-CM | POA: Diagnosis present

## 2022-09-17 DIAGNOSIS — A419 Sepsis, unspecified organism: Principal | ICD-10-CM | POA: Diagnosis present

## 2022-09-17 DIAGNOSIS — Z87440 Personal history of urinary (tract) infections: Secondary | ICD-10-CM

## 2022-09-17 DIAGNOSIS — Z8051 Family history of malignant neoplasm of kidney: Secondary | ICD-10-CM

## 2022-09-17 DIAGNOSIS — Z1152 Encounter for screening for COVID-19: Secondary | ICD-10-CM

## 2022-09-17 DIAGNOSIS — N10 Acute tubulo-interstitial nephritis: Secondary | ICD-10-CM | POA: Diagnosis present

## 2022-09-17 DIAGNOSIS — Z20822 Contact with and (suspected) exposure to covid-19: Secondary | ICD-10-CM | POA: Diagnosis present

## 2022-09-17 DIAGNOSIS — K760 Fatty (change of) liver, not elsewhere classified: Secondary | ICD-10-CM | POA: Diagnosis present

## 2022-09-17 DIAGNOSIS — B9620 Unspecified Escherichia coli [E. coli] as the cause of diseases classified elsewhere: Secondary | ICD-10-CM | POA: Diagnosis present

## 2022-09-17 DIAGNOSIS — N3090 Cystitis, unspecified without hematuria: Secondary | ICD-10-CM | POA: Diagnosis present

## 2022-09-17 DIAGNOSIS — N12 Tubulo-interstitial nephritis, not specified as acute or chronic: Principal | ICD-10-CM

## 2022-09-17 DIAGNOSIS — R000 Tachycardia, unspecified: Secondary | ICD-10-CM

## 2022-09-17 DIAGNOSIS — F1090 Alcohol use, unspecified, uncomplicated: Secondary | ICD-10-CM | POA: Diagnosis present

## 2022-09-17 DIAGNOSIS — E871 Hypo-osmolality and hyponatremia: Secondary | ICD-10-CM | POA: Diagnosis present

## 2022-09-17 DIAGNOSIS — N390 Urinary tract infection, site not specified: Secondary | ICD-10-CM | POA: Diagnosis present

## 2022-09-17 DIAGNOSIS — Z87891 Personal history of nicotine dependence: Secondary | ICD-10-CM

## 2022-09-17 LAB — COMPREHENSIVE METABOLIC PANEL
ALT: 27 U/L (ref 0–44)
AST: 40 U/L (ref 15–41)
Albumin: 4.2 g/dL (ref 3.5–5.0)
Alkaline Phosphatase: 69 U/L (ref 38–126)
Anion gap: 17 — ABNORMAL HIGH (ref 5–15)
BUN: 9 mg/dL (ref 6–20)
CO2: 26 mmol/L (ref 22–32)
Calcium: 9.2 mg/dL (ref 8.9–10.3)
Chloride: 87 mmol/L — ABNORMAL LOW (ref 98–111)
Creatinine, Ser: 0.98 mg/dL (ref 0.44–1.00)
GFR, Estimated: >60 mL/min (ref >60)
Glucose, Bld: 104 mg/dL — ABNORMAL HIGH (ref 70–99)
Potassium: 3.3 mmol/L — ABNORMAL LOW (ref 3.5–5.1)
Sodium: 130 mmol/L — ABNORMAL LOW (ref 135–145)
Total Bilirubin: 1.5 mg/dL — ABNORMAL HIGH (ref 0.3–1.2)
Total Protein: 8.2 g/dL — ABNORMAL HIGH (ref 6.5–8.1)

## 2022-09-17 LAB — URINALYSIS, ROUTINE W REFLEX MICROSCOPIC
Bilirubin Urine: NEGATIVE
Glucose, UA: NEGATIVE mg/dL
Ketones, ur: 80 mg/dL — AB
Nitrite: POSITIVE — AB
Protein, ur: 100 mg/dL — AB
Specific Gravity, Urine: 1.015 (ref 1.005–1.030)
WBC, UA: >50 WBC/hpf (ref 0–5)
pH: 6 (ref 5.0–8.0)

## 2022-09-17 LAB — PREGNANCY, URINE: Preg Test, Ur: NEGATIVE

## 2022-09-17 LAB — CBC WITH DIFFERENTIAL/PLATELET
Abs Immature Granulocytes: 0.05 10*3/uL (ref 0.00–0.07)
Basophils Absolute: 0 10*3/uL (ref 0.0–0.1)
Basophils Relative: 0 %
Eosinophils Absolute: 0 10*3/uL (ref 0.0–0.5)
Eosinophils Relative: 0 %
HCT: 38.2 % (ref 36.0–46.0)
Hemoglobin: 12.6 g/dL (ref 12.0–15.0)
Immature Granulocytes: 0 %
Lymphocytes Relative: 9 %
Lymphs Abs: 1.2 10*3/uL (ref 0.7–4.0)
MCH: 31 pg (ref 26.0–34.0)
MCHC: 33 g/dL (ref 30.0–36.0)
MCV: 94.1 fL (ref 80.0–100.0)
Monocytes Absolute: 1.2 10*3/uL — ABNORMAL HIGH (ref 0.1–1.0)
Monocytes Relative: 9 %
Neutro Abs: 10.7 10*3/uL — ABNORMAL HIGH (ref 1.7–7.7)
Neutrophils Relative %: 82 %
Platelets: 408 10*3/uL — ABNORMAL HIGH (ref 150–400)
RBC: 4.06 MIL/uL (ref 3.87–5.11)
RDW: 13 % (ref 11.5–15.5)
WBC: 13.2 10*3/uL — ABNORMAL HIGH (ref 4.0–10.5)
nRBC: 0 % (ref 0.0–0.2)

## 2022-09-17 LAB — RESP PANEL BY RT-PCR (RSV, FLU A&B, COVID)  RVPGX2
Influenza A by PCR: NEGATIVE
Influenza B by PCR: NEGATIVE
Resp Syncytial Virus by PCR: NEGATIVE
SARS Coronavirus 2 by RT PCR: NEGATIVE

## 2022-09-17 LAB — TROPONIN I (HIGH SENSITIVITY): Troponin I (High Sensitivity): 4 ng/L (ref <18)

## 2022-09-17 LAB — I-STAT CG4 LACTIC ACID, ED: Lactic Acid, Venous: 1.1 mmol/L (ref 0.5–1.9)

## 2022-09-17 MED ORDER — LACTATED RINGERS IV BOLUS (SEPSIS): 500.0000 mL | Freq: Once | INTRAVENOUS | Status: DC

## 2022-09-17 MED ORDER — SODIUM CHLORIDE 0.9 % IV SOLN
2.0000 g | INTRAVENOUS | Status: DC
  Administered 2022-09-17 – 2022-09-19 (×3): 2 g via INTRAVENOUS
  Filled 2022-09-17 (×3): qty 20

## 2022-09-17 MED ORDER — LACTATED RINGERS IV SOLN: INTRAVENOUS | Status: DC

## 2022-09-17 MED ORDER — LACTATED RINGERS IV BOLUS (SEPSIS): 1000.0000 mL | Freq: Once | INTRAVENOUS | Status: DC

## 2022-09-17 MED ORDER — LACTATED RINGERS IV BOLUS (SEPSIS): 250.0000 mL | Freq: Once | INTRAVENOUS | Status: DC

## 2022-09-17 MED ORDER — IOHEXOL 350 MG/ML SOLN
75.0000 mL | Freq: Once | INTRAVENOUS | Status: AC | PRN
  Administered 2022-09-17: 75 mL via INTRAVENOUS

## 2022-09-17 MED ORDER — LACTATED RINGERS IV BOLUS (SEPSIS)
1000.0000 mL | Freq: Once | INTRAVENOUS | Status: AC
  Administered 2022-09-18: 1000 mL via INTRAVENOUS

## 2022-09-17 MED ORDER — ONDANSETRON HCL 4 MG/2ML IJ SOLN
4.0000 mg | Freq: Once | INTRAMUSCULAR | Status: AC
  Administered 2022-09-17: 4 mg via INTRAVENOUS
  Filled 2022-09-17: qty 2

## 2022-09-17 NOTE — ED Triage Notes (Addendum)
Patient c/o covid symptoms, and possible kidney infection.  Patient endorses cough, chest pain, headache, fevers, and pain with urination.  Patient reports she has had recent covid exposure.

## 2022-09-17 NOTE — ED Provider Notes (Signed)
Pt well appearing, no distress, reporting dysuria, abdominal pain and back pain Code sepsis called due to tachycardia and leukocytosis with evidence of UTI Lactate normal Patient receiving IV antibiotics  ED ECG REPORT   Date: 09/17/2022 2314  Rate: 115  Rhythm: sinus tachycardia  QRS Axis: normal  Intervals: normal  ST/T Wave abnormalities: normal  Conduction Disutrbances:none    I have personally reviewed the EKG tracing and agree with the computerized printout as noted.    Zadie Rhine, MD 09/17/22 2329

## 2022-09-17 NOTE — ED Provider Notes (Signed)
[place] EMERGENCY DEPARTMENT AT [facility] Provider Note   CSN: [id number] Arrival date & time: 09/17/22  2155     History {Add pertinent medical, surgical, social history, OB history to HPI:1} Chief Complaint  Patient presents with   Covid Exposure   Dysuria    Kaitlin Lewis is a 32 y.o. female.  Patient presents to the emergency department complaining of cough, headache, fever, bilateral low back pain, body aches, chills, and dysuria.  She states that she has a history of multiple UTIs and that she is worried she may have a kidney infection.  She also states she was recently exposed to family members who were diagnosed with COVID.  The patient also endorses nausea and subjective fevers.  She denies abdominal pain (other than flank pain), emesis, shortness of breath, vaginal discharge.  Past medical history otherwise significant for migraines  HPI     Home Medications Prior to Admission medications   Medication Sig Start Date End Date Taking? Authorizing Provider  amoxicillin-clavulanate (AUGMENTIN) 875-125 MG tablet Take 1 tablet by mouth every 12 (twelve) hours. 06/16/21   Prosperi, Christian H, PA-C  oxyCODONE-acetaminophen (PERCOCET/ROXICET) 5-325 MG tablet Take 1-2 tablets by mouth every 6 (six) hours as needed for severe pain. 06/24/20   Lorre Nick, MD      Allergies    Patient has no known allergies.    Review of Systems   Review of Systems  Physical Exam Updated Vital Signs BP (!) 122/91 (BP Location: Right Arm)   Pulse (!) 142   Temp 99.9 F (37.7 C)   Resp 19   Wt 53.1 kg   LMP 08/23/2022 (Approximate)   SpO2 97%   BMI 21.40 kg/m  Physical Exam Vitals and nursing note reviewed.  Constitutional:      General: She is not in acute distress.    Appearance: She is well-developed.  HENT:     Head: Normocephalic and atraumatic.     Mouth/Throat:     Mouth: Mucous membranes are moist.  Eyes:     General: No scleral icterus.     Conjunctiva/sclera: Conjunctivae normal.  Cardiovascular:     Rate and Rhythm: Regular rhythm. Tachycardia present.     Heart sounds: No murmur heard. Pulmonary:     Effort: Pulmonary effort is normal. No respiratory distress.     Breath sounds: Normal breath sounds. No wheezing, rhonchi or rales.  Abdominal:     Palpations: Abdomen is soft.     Tenderness: There is no abdominal tenderness. There is right CVA tenderness and left CVA tenderness.  Musculoskeletal:        General: No swelling.     Cervical back: Neck supple.  Skin:    General: Skin is warm and dry.     Capillary Refill: Capillary refill takes less than 2 seconds.  Neurological:     Mental Status: She is alert.     Motor: No weakness.  Psychiatric:        Mood and Affect: Mood normal.     ED Results / Procedures / Treatments   Labs (all labs ordered are listed, but only abnormal results are displayed) Labs Reviewed  URINALYSIS, ROUTINE W REFLEX MICROSCOPIC - Abnormal; Notable for the following components:      Result Value   Color, Urine AMBER (*)    APPearance CLOUDY (*)    Hgb urine dipstick MODERATE (*)    Ketones, ur 80 (*)    Protein, ur 100 (*)  Nitrite POSITIVE (*)    Leukocytes,Ua LARGE (*)    Bacteria, UA MANY (*)    All other components within normal limits  CBC WITH DIFFERENTIAL/PLATELET - Abnormal; Notable for the following components:   WBC 13.2 (*)    Platelets 408 (*)    Neutro Abs 10.7 (*)    Monocytes Absolute 1.2 (*)    All other components within normal limits  COMPREHENSIVE METABOLIC PANEL - Abnormal; Notable for the following components:   Sodium 130 (*)    Potassium 3.3 (*)    Chloride 87 (*)    Glucose, Bld 104 (*)    Total Protein 8.2 (*)    Total Bilirubin 1.5 (*)    Anion gap 17 (*)    All other components within normal limits  RESP PANEL BY RT-PCR (RSV, FLU A&B, COVID)  RVPGX2  CULTURE, BLOOD (ROUTINE X 2)  CULTURE, BLOOD (ROUTINE X 2)  PREGNANCY, URINE  URINALYSIS,  W/ REFLEX TO CULTURE (INFECTION SUSPECTED)  I-STAT CG4 LACTIC ACID, ED  TROPONIN I (HIGH SENSITIVITY)    EKG None  Radiology DG Chest 2 View  Result Date: 09/17/2022 CLINICAL DATA:  Cough EXAM: CHEST - 2 VIEW COMPARISON:  06/24/2020 FINDINGS: The heart size and mediastinal contours are within normal limits. Both lungs are clear. The visualized skeletal structures are unremarkable. IMPRESSION: No active cardiopulmonary disease. Electronically Signed   By: Alcide Clever M.D.   On: 09/17/2022 22:27    Procedures .Critical Care  Performed by: Darrick Grinder, PA-C Authorized by: Darrick Grinder, PA-C   Critical care provider statement:    Critical care time (minutes):  35   Critical care was necessary to treat or prevent imminent or life-threatening deterioration of the following conditions:  Sepsis   Critical care was time spent personally by me on the following activities:  Development of treatment plan with patient or surrogate, discussions with consultants, evaluation of patient's response to treatment, examination of patient, ordering and review of laboratory studies, ordering and review of radiographic studies, ordering and performing treatments and interventions, pulse oximetry, re-evaluation of patient's condition and review of old charts   {Document cardiac monitor, telemetry assessment procedure when appropriate:1}  Medications Ordered in ED Medications  lactated ringers bolus 1,000 mL (has no administration in time range)  ondansetron (ZOFRAN) injection 4 mg (has no administration in time range)  lactated ringers infusion (has no administration in time range)  cefTRIAXone (ROCEPHIN) 2 g in sodium chloride 0.9 % 100 mL IVPB (has no administration in time range)  lactated ringers bolus 1,000 mL (has no administration in time range)    And  lactated ringers bolus 500 mL (has no administration in time range)    And  lactated ringers bolus 250 mL (has no administration in time  range)    ED Course/ Medical Decision Making/ A&P   {   Click here for ABCD2, HEART and other calculatorsREFRESH Note before signing :1}                              Medical Decision Making Amount and/or Complexity of Data Reviewed Labs: ordered. Radiology: ordered.  Risk Prescription drug management.   This patient presents to the ED for concern of viral symptoms and dysuria, this involves an extensive number of treatment options, and is a complaint that carries with it a high risk of complications and morbidity.  The differential diagnosis includes viral infection including COVID,  influenza, and others, urinary tract infection, pyelonephritis, cystitis, sepsis, others   Co morbidities that complicate the patient evaluation  History of migraines and frequent UTIs    Lab Tests:  I Ordered, and personally interpreted labs.  The pertinent results include: UA with moderate hemoglobin, protein, ketones, nitrate positive, large leukocytes, many bacteria, greater than 50 WBC; WBC 13.2; negative pregnancy test; anion gap 17   Imaging Studies ordered:  I ordered imaging studies including chest x-ray, CT abdomen pelvis with contrast I independently visualized and interpreted imaging which showed no acute disease on chest x-ray 1. Wall thickening involving the bladder, ureters, and renal  collecting systems suggesting cystitis and pyelonephritis. No  abscess.  2. Debris demonstrated inside the vagina possibly representing  infection, blood products, or fistula. Correlate with physical  examination.  3. Mild diffuse fatty infiltration of the liver.   I agree with the radiologist interpretation   Cardiac Monitoring: / EKG:  The patient was maintained on a cardiac monitor.  I personally viewed and interpreted the cardiac monitored which showed an underlying rhythm of: ***   Consultations Obtained:  I requested consultation with the ***,  and discussed lab and imaging findings  as well as pertinent plan - they recommend: ***   Problem List / ED Course / Critical interventions / Medication management  I ordered medication including Rocephin for UTI/sepsis, LR bolus due to sepsis criteria, Zofran for nausea Reevaluation of the patient after these medicines showed that the patient {resolved/improved/worsened:23923::"improved"} I have reviewed the patients home medicines and have made adjustments as needed   Social Determinants of Health:  Patient has no primary care provider  Test / Admission - Considered:  On initial presentation patient was tachycardic with a temperature of 99.9.  WBC 13.2.  Patient meeting SIRS criteria with tachycardia and elevated white count, infection source with urine.  Code sepsis activated.  Appropriate antibiotics and fluid bolus were ordered.   {Document critical care time when appropriate:1} {Document review of labs and clinical decision tools ie heart score, Chads2Vasc2 etc:1}  {Document your independent review of radiology images, and any outside records:1} {Document your discussion with family members, caretakers, and with consultants:1} {Document social determinants of health affecting pt's care:1} {Document your decision making why or why not admission, treatments were needed:1} Final Clinical Impression(s) / ED Diagnoses Final diagnoses:  None    Rx / DC Orders ED Discharge Orders     None

## 2022-09-18 ENCOUNTER — Observation Stay (HOSPITAL_COMMUNITY): Payer: MEDICAID

## 2022-09-18 DIAGNOSIS — N390 Urinary tract infection, site not specified: Secondary | ICD-10-CM

## 2022-09-18 DIAGNOSIS — N12 Tubulo-interstitial nephritis, not specified as acute or chronic: Secondary | ICD-10-CM | POA: Diagnosis not present

## 2022-09-18 DIAGNOSIS — A419 Sepsis, unspecified organism: Secondary | ICD-10-CM | POA: Diagnosis not present

## 2022-09-18 LAB — CBC
HCT: 31.2 % — ABNORMAL LOW (ref 36.0–46.0)
Hemoglobin: 10.3 g/dL — ABNORMAL LOW (ref 12.0–15.0)
MCH: 31.2 pg (ref 26.0–34.0)
MCHC: 33 g/dL (ref 30.0–36.0)
MCV: 94.5 fL (ref 80.0–100.0)
Platelets: 266 10*3/uL (ref 150–400)
RBC: 3.3 MIL/uL — ABNORMAL LOW (ref 3.87–5.11)
RDW: 13.1 % (ref 11.5–15.5)
WBC: 9.6 10*3/uL (ref 4.0–10.5)
nRBC: 0 % (ref 0.0–0.2)

## 2022-09-18 LAB — COMPREHENSIVE METABOLIC PANEL
ALT: 18 U/L (ref 0–44)
AST: 27 U/L (ref 15–41)
Albumin: 3.1 g/dL — ABNORMAL LOW (ref 3.5–5.0)
Alkaline Phosphatase: 53 U/L (ref 38–126)
Anion gap: 13 (ref 5–15)
BUN: 5 mg/dL — ABNORMAL LOW (ref 6–20)
CO2: 23 mmol/L (ref 22–32)
Calcium: 8.3 mg/dL — ABNORMAL LOW (ref 8.9–10.3)
Chloride: 96 mmol/L — ABNORMAL LOW (ref 98–111)
Creatinine, Ser: 0.84 mg/dL (ref 0.44–1.00)
GFR, Estimated: >60 mL/min (ref >60)
Glucose, Bld: 124 mg/dL — ABNORMAL HIGH (ref 70–99)
Potassium: 3.9 mmol/L (ref 3.5–5.1)
Sodium: 132 mmol/L — ABNORMAL LOW (ref 135–145)
Total Bilirubin: 1 mg/dL (ref 0.3–1.2)
Total Protein: 6.2 g/dL — ABNORMAL LOW (ref 6.5–8.1)

## 2022-09-18 LAB — HIV ANTIBODY (ROUTINE TESTING W REFLEX): HIV Screen 4th Generation wRfx: NONREACTIVE

## 2022-09-18 MED ORDER — HYDRALAZINE HCL 20 MG/ML IJ SOLN: 5.0000 mg | Freq: Three times a day (TID) | INTRAMUSCULAR | Status: DC | PRN

## 2022-09-18 MED ORDER — ONDANSETRON HCL 4 MG PO TABS: 4.0000 mg | ORAL_TABLET | Freq: Four times a day (QID) | ORAL | Status: DC | PRN

## 2022-09-18 MED ORDER — HYDROCODONE-ACETAMINOPHEN 5-325 MG PO TABS
1.0000 | ORAL_TABLET | ORAL | Status: DC | PRN
  Administered 2022-09-18: 2 via ORAL
  Filled 2022-09-18: qty 2

## 2022-09-18 MED ORDER — BISACODYL 5 MG PO TBEC
5.0000 mg | DELAYED_RELEASE_TABLET | Freq: Every day | ORAL | Status: DC | PRN
  Filled 2022-09-18: qty 1

## 2022-09-18 MED ORDER — FENTANYL CITRATE PF 50 MCG/ML IJ SOSY
50.0000 ug | PREFILLED_SYRINGE | Freq: Once | INTRAMUSCULAR | Status: AC
  Administered 2022-09-18: 50 ug via INTRAVENOUS
  Filled 2022-09-18: qty 1

## 2022-09-18 MED ORDER — ACETAMINOPHEN 650 MG RE SUPP: 650.0000 mg | Freq: Four times a day (QID) | RECTAL | Status: DC | PRN

## 2022-09-18 MED ORDER — TRAZODONE HCL 50 MG PO TABS: 25.0000 mg | ORAL_TABLET | Freq: Every evening | ORAL | Status: DC | PRN

## 2022-09-18 MED ORDER — SODIUM CHLORIDE 0.9 % IV SOLN: INTRAVENOUS | Status: AC

## 2022-09-18 MED ORDER — SODIUM CHLORIDE 0.9 % IV SOLN: 2.0000 g | INTRAVENOUS | Status: DC

## 2022-09-18 MED ORDER — IPRATROPIUM BROMIDE 0.02 % IN SOLN: 0.5000 mg | Freq: Four times a day (QID) | RESPIRATORY_TRACT | Status: DC | PRN

## 2022-09-18 MED ORDER — ONDANSETRON HCL 4 MG/2ML IJ SOLN: 4.0000 mg | Freq: Four times a day (QID) | INTRAMUSCULAR | Status: DC | PRN

## 2022-09-18 MED ORDER — PHENAZOPYRIDINE HCL 100 MG PO TABS
100.0000 mg | ORAL_TABLET | Freq: Three times a day (TID) | ORAL | Status: AC
  Administered 2022-09-18 – 2022-09-19 (×6): 100 mg via ORAL
  Filled 2022-09-18 (×7): qty 1

## 2022-09-18 MED ORDER — ALBUTEROL SULFATE (2.5 MG/3ML) 0.083% IN NEBU: 2.5000 mg | INHALATION_SOLUTION | Freq: Four times a day (QID) | RESPIRATORY_TRACT | Status: DC | PRN

## 2022-09-18 MED ORDER — SODIUM CHLORIDE 0.9% FLUSH
3.0000 mL | Freq: Two times a day (BID) | INTRAVENOUS | Status: DC
  Administered 2022-09-19 – 2022-09-20 (×3): 3 mL via INTRAVENOUS

## 2022-09-18 MED ORDER — SENNOSIDES-DOCUSATE SODIUM 8.6-50 MG PO TABS: 1.0000 | ORAL_TABLET | Freq: Every evening | ORAL | Status: DC | PRN

## 2022-09-18 MED ORDER — LACTATED RINGERS IV SOLN: INTRAVENOUS | Status: DC

## 2022-09-18 MED ORDER — MORPHINE SULFATE (PF) 2 MG/ML IV SOLN: 1.0000 mg | Freq: Four times a day (QID) | INTRAVENOUS | Status: DC | PRN

## 2022-09-18 MED ORDER — ACETAMINOPHEN 325 MG PO TABS
650.0000 mg | ORAL_TABLET | Freq: Four times a day (QID) | ORAL | Status: DC | PRN
  Administered 2022-09-18 – 2022-09-19 (×4): 650 mg via ORAL
  Filled 2022-09-18 (×5): qty 2

## 2022-09-18 MED ORDER — POTASSIUM CHLORIDE 20 MEQ PO PACK
40.0000 meq | PACK | Freq: Once | ORAL | Status: AC
  Administered 2022-09-18: 40 meq via ORAL
  Filled 2022-09-18: qty 2

## 2022-09-18 NOTE — Plan of Care (Signed)
  Problem: Fluid Volume: Goal: Hemodynamic stability will improve Outcome: Progressing   Problem: Respiratory: Goal: Ability to maintain adequate ventilation will improve Outcome: Progressing   Problem: Education: Goal: Knowledge of General Education information will improve Description: Including pain rating scale, medication(s)/side effects and non-pharmacologic comfort measures Outcome: Progressing   Problem: Clinical Measurements: Goal: Ability to maintain clinical measurements within normal limits will improve Outcome: Progressing   Problem: Activity: Goal: Risk for activity intolerance will decrease Outcome: Progressing

## 2022-09-18 NOTE — Sepsis Progress Note (Signed)
Elink monitoring for the code sepsis protocol.  

## 2022-09-18 NOTE — Progress Notes (Signed)
Transition of Care [facility]) - Inpatient Brief Assessment   Patient Details  Name: KEONTE STANDFIELD MRN: [id number] Date of Birth: 04/21/1990  [facility] CM/SW Contact:    Janae Bridgeman, RN Phone Number: 09/18/2022, 4:41 PM   Clinical Narrative: Patient admitted to the hospital for pyelonephritis and is currently receiving IV antibiotics.  I called and spoke with the patient by phone and patient states that she obtained a new PCP through her medicaid coverage and plans to follow up with [facility] in the next 7-10 days - placed in the AVS to patient to call and schedule.  No other TOC needs at this time.   Transition of Care Asessment: Insurance and Status: (P) Insurance coverage has been reviewed Patient has primary care physician: (P) Yes (Patient states that her PCP is [facility] on [address]) Home environment has been reviewed: (P) Yes - lives at home with spouse Prior level of function:: (P) Independent Prior/Current Home Services: (P) No current home services Social Determinants of Health Reivew: (P) SDOH reviewed no interventions necessary Readmission risk has been reviewed: (P) Yes Transition of care needs: (P) no transition of care needs at this time

## 2022-09-18 NOTE — Progress Notes (Signed)
Patient admitted after midnight, please see H&P.  Here with:  Sepsis secondary to pyelonephritis/cystitis - IV antibiotics: Rocephin-- await culture - IV fluid hydration- will place end time and then re-assess -Pain control     Mild hypokalemia - Replaced    Mild hyponatremia - monitor    Marlin Canary DO

## 2022-09-18 NOTE — H&P (Signed)
History and Physical   [facility] - [place] @ [facility] Admission History and Physical [employer], D.O.    Patient Name: Kaitlin Lewis MR#: [id number] Date of Birth: July 05, 1990 Date of Admission: 09/17/2022  Referring MD/NP/PA: Barrie Dunker Primary Care Physician: Patient, No Pcp Per  Chief Complaint:  Chief Complaint  Patient presents with   Covid Exposure   Dysuria    HPI: Kaitlin Lewis is a 32 y.o. female with history of migraine headaches  presents to the emergency department for evaluation of low back pain left greater than right, fevers, chills, dysuria for the past 2 weeks.  She has been taking Azo as an outpatient but no biotics.  She has had urinary tract infections in the past..    Patient denies weakness, dizziness, chest pain, shortness of breath, N/V/C/D, abdominal pain, changes in mental status.    Otherwise there has been no change in status. Patient has been taking medication as prescribed and there has been no recent change in medication or diet.  No recent antibiotics.  There has been no recent illness, hospitalizations, travel or sick contacts.    EMS/ED Course: Patient received fentanyl, Rocephin, Zofran, lactated Ringer's. Medical admission has been requested for further management of sepsis secondary to [place].  Review of Systems:  CONSTITUTIONAL: Positive fever/chills, negative fatigue, weakness, weight gain/loss, headache. EYES: No blurry or double vision. ENT: No tinnitus, postnasal drip, redness or soreness of the oropharynx. RESPIRATORY: No cough, dyspnea, wheeze.  No hemoptysis.  CARDIOVASCULAR: No chest pain, palpitations, syncope, orthopnea. No lower extremity edema.  GASTROINTESTINAL: No nausea, vomiting, abdominal pain, diarrhea, constipation.  No hematemesis, melena or hematochezia. GENITOURINARY: Positive dysuria, frequency, negative hematuria. ENDOCRINE: No polyuria or nocturia. No heat or cold intolerance. HEMATOLOGY: No anemia,  bruising, bleeding. INTEGUMENTARY: No rashes, ulcers, lesions. MUSCULOSKELETAL: No arthritis, gout. NEUROLOGIC: No numbness, tingling, ataxia, seizure-type activity, weakness. PSYCHIATRIC: No anxiety, depression, insomnia.   Past Medical History:  Diagnosis Date   Migraine     History reviewed. No pertinent surgical history.   reports that she has been smoking cigarettes. She has never used smokeless tobacco. She reports current alcohol use. She reports that she does not use drugs.  No Known Allergies  History reviewed. No pertinent family history. Mother recently diagnosed with renal cell carcinoma and anemia requiring transfusions  Prior to Admission medications   Medication Sig Start Date End Date Taking? Authorizing Provider  amoxicillin-clavulanate (AUGMENTIN) 875-125 MG tablet Take 1 tablet by mouth every 12 (twelve) hours. 06/16/21   Prosperi, Christian H, PA-C  oxyCODONE-acetaminophen (PERCOCET/ROXICET) 5-325 MG tablet Take 1-2 tablets by mouth every 6 (six) hours as needed for severe pain. 06/24/20   Lorre Nick, MD    Physical Exam: Vitals:   09/18/22 0200 09/18/22 0249 09/18/22 0250 09/18/22 0250  BP: 110/80 109/72    Pulse: 100  (!) 107   Resp: 16 14    Temp:    99.8 F (37.7 C)  TempSrc:    Oral  SpO2: 100% 100%    Weight:        GENERAL: 32 y.o.-year-old white female patient, well-developed, well-nourished lying in the bed in no acute distress.  Pleasant and cooperative.   HEENT: Head atraumatic, normocephalic. Pupils equal. Mucus membranes moist. NECK: Supple. No JVD. CHEST: Normal breath sounds bilaterally. No wheezing, rales, rhonchi or crackles. No use of accessory muscles of respiration.  No reproducible chest wall tenderness.  CARDIOVASCULAR: S1, S2 normal. No murmurs, rubs, or gallops. Cap refill <2 seconds. Pulses  intact distally.  ABDOMEN: Soft, nondistended, nontender.  Positive left-sided CVA tenderness no rebound, guarding, rigidity. Normoactive  bowel sounds present in all four quadrants.  EXTREMITIES: No pedal edema, cyanosis, or clubbing. No calf tenderness or Homan's sign.  NEUROLOGIC: The patient is alert and oriented x 3. Cranial nerves II through XII are grossly intact with no focal sensorimotor deficit. PSYCHIATRIC:  Normal affect, mood, thought content. SKIN: Warm, dry, and intact without obvious rash, lesion, or ulcer.    Labs on Admission:  CBC: Recent Labs  Lab 09/17/22 2205  WBC 13.2*  NEUTROABS 10.7*  HGB 12.6  HCT 38.2  MCV 94.1  PLT 408*   Basic Metabolic Panel: Recent Labs  Lab 09/17/22 2205  NA 130*  K 3.3*  CL 87*  CO2 26  GLUCOSE 104*  BUN 9  CREATININE 0.98  CALCIUM 9.2   GFR: Estimated Creatinine Clearance: 65.2 mL/min (by C-G formula based on SCr of 0.98 mg/dL). Liver Function Tests: Recent Labs  Lab 09/17/22 2205  AST 40  ALT 27  ALKPHOS 69  BILITOT 1.5*  PROT 8.2*  ALBUMIN 4.2   No results for input(s): "LIPASE", "AMYLASE" in the last 168 hours. No results for input(s): "AMMONIA" in the last 168 hours. Coagulation Profile: No results for input(s): "INR", "PROTIME" in the last 168 hours. Cardiac Enzymes: No results for input(s): "CKTOTAL", "CKMB", "CKMBINDEX", "TROPONINI" in the last 168 hours. BNP (last 3 results) No results for input(s): "PROBNP" in the last 8760 hours. HbA1C: No results for input(s): "HGBA1C" in the last 72 hours. CBG: No results for input(s): "GLUCAP" in the last 168 hours. Lipid Profile: No results for input(s): "CHOL", "HDL", "LDLCALC", "TRIG", "CHOLHDL", "LDLDIRECT" in the last 72 hours. Thyroid Function Tests: No results for input(s): "TSH", "T4TOTAL", "FREET4", "T3FREE", "THYROIDAB" in the last 72 hours. Anemia Panel: No results for input(s): "VITAMINB12", "FOLATE", "FERRITIN", "TIBC", "IRON", "RETICCTPCT" in the last 72 hours. Urine analysis:    Component Value Date/Time   COLORURINE AMBER (A) 09/17/2022 2205   APPEARANCEUR CLOUDY (A)  09/17/2022 2205   APPEARANCEUR Cloudy 01/12/2012 2133   LABSPEC 1.015 09/17/2022 2205   LABSPEC 1.009 01/12/2012 2133   PHURINE 6.0 09/17/2022 2205   GLUCOSEU NEGATIVE 09/17/2022 2205   GLUCOSEU Negative 01/12/2012 2133   HGBUR MODERATE (A) 09/17/2022 2205   BILIRUBINUR NEGATIVE 09/17/2022 2205   BILIRUBINUR Negative 01/12/2012 2133   KETONESUR 80 (A) 09/17/2022 2205   PROTEINUR 100 (A) 09/17/2022 2205   NITRITE POSITIVE (A) 09/17/2022 2205   LEUKOCYTESUR LARGE (A) 09/17/2022 2205   LEUKOCYTESUR Trace 01/12/2012 2133   Sepsis Labs: @LABRCNTIP (procalcitonin:4,lacticidven:4) ) Recent Results (from the past 240 hour(s))  Resp panel by RT-PCR (RSV, Flu A&B, Covid) Anterior Nasal Swab     Status: None   Collection Time: 09/17/22 10:03 PM   Specimen: Anterior Nasal Swab  Result Value Ref Range Status   SARS Coronavirus 2 by RT PCR NEGATIVE NEGATIVE Final   Influenza A by PCR NEGATIVE NEGATIVE Final   Influenza B by PCR NEGATIVE NEGATIVE Final    Comment: (NOTE) The Xpert Xpress SARS-CoV-2/FLU/RSV plus assay is intended as an aid in the diagnosis of influenza from Nasopharyngeal swab specimens and should not be used as a sole basis for treatment. Nasal washings and aspirates are unacceptable for Xpert Xpress SARS-CoV-2/FLU/RSV testing.  Fact Sheet for Patients: BloggerCourse.com  Fact Sheet for Healthcare Providers: SeriousBroker.it  This test is not yet approved or cleared by the [place] FDA and has been authorized for detection  and/or diagnosis of SARS-CoV-2 by FDA under an Emergency Use Authorization (EUA). This EUA will remain in effect (meaning this test can be used) for the duration of the COVID-19 declaration under Section 564(b)(1) of the Act, 21 U.S.C. section 360bbb-3(b)(1), unless the authorization is terminated or revoked.     Resp Syncytial Virus by PCR NEGATIVE NEGATIVE Final    Comment: (NOTE) Fact Sheet  for Patients: BloggerCourse.com  Fact Sheet for Healthcare Providers: SeriousBroker.it  This test is not yet approved or cleared by the [place] FDA and has been authorized for detection and/or diagnosis of SARS-CoV-2 by FDA under an Emergency Use Authorization (EUA). This EUA will remain in effect (meaning this test can be used) for the duration of the COVID-19 declaration under Section 564(b)(1) of the Act, 21 U.S.C. section 360bbb-3(b)(1), unless the authorization is terminated or revoked.  Performed at [facility], [address], [place], [place] [address]      Radiological Exams on Admission: CT ABDOMEN PELVIS W CONTRAST  Result Date: 09/17/2022 CLINICAL DATA:  Bilateral flank pain EXAM: CT ABDOMEN AND PELVIS WITH CONTRAST TECHNIQUE: Multidetector CT imaging of the abdomen and pelvis was performed using the standard protocol following bolus administration of intravenous contrast. RADIATION DOSE REDUCTION: This exam was performed according to the departmental dose-optimization program which includes automated exposure control, adjustment of the mA and/or kV according to patient size and/or use of iterative reconstruction technique. CONTRAST:  75mL OMNIPAQUE IOHEXOL 350 MG/ML SOLN COMPARISON:  None 04/11/2019 FINDINGS: Lower chest: Lung bases are clear. Hepatobiliary: Mild diffuse fatty infiltration of the liver. No focal lesions. Gallbladder and bile ducts are normal. Pancreas: Unremarkable. No pancreatic ductal dilatation or surrounding inflammatory changes. Spleen: Normal in size without focal abnormality. Adrenals/Urinary Tract: Nephrograms are symmetrical. No hydronephrosis or hydroureter. The bladder wall and ureteral walls as well as the renal collecting systems bilaterally demonstrate diffuse wall thickening and hyperemia. This likely indicates cystitis and pyelonephritis. No abscess. Stomach/Bowel: Stomach, small bowel,  and colon are not abnormally distended. No wall thickening or inflammatory changes. Appendix is normal. Vascular/Lymphatic: No significant vascular findings are present. No enlarged abdominal or pelvic lymph nodes. Reproductive: Uterus and ovaries are not enlarged. Intravaginal debris is suggested, possibly indicating infection. Blood products or even a fistula could also produce this appearance. Correlate with physical examination. Other: No abdominal wall hernia or abnormality. No abdominopelvic ascites. Musculoskeletal: No acute or significant osseous findings. IMPRESSION: 1. Wall thickening involving the bladder, ureters, and renal collecting systems suggesting cystitis and pyelonephritis. No abscess. 2. Debris demonstrated inside the vagina possibly representing infection, blood products, or fistula. Correlate with physical examination. 3. Mild diffuse fatty infiltration of the liver. Electronically Signed   By: Burman Nieves M.D.   On: 09/17/2022 23:45   DG Chest 2 View  Result Date: 09/17/2022 CLINICAL DATA:  Cough EXAM: CHEST - 2 VIEW COMPARISON:  06/24/2020 FINDINGS: The heart size and mediastinal contours are within normal limits. Both lungs are clear. The visualized skeletal structures are unremarkable. IMPRESSION: No active cardiopulmonary disease. Electronically Signed   By: Alcide Clever M.D.   On: 09/17/2022 22:27    EKG: Sinus tach at 110 on the monitor  Assessment/Plan  This is a 32 y.o. female with a history of migraine headaches now being admitted with:  #. Sepsis secondary to pyelonephritis/cystitis - Admit to inpatient with telemetry monitoring - IV antibiotics: Rocephin - IV fluid hydration -Pain control - Follow up blood,urine cultures - Repeat CBC in am.   #.  Mild hypokalemia - Replace orally - Check mag level  #.  Mild hyponatremia - Received LR - Continue IV fluid replacement  #.  Finding of debris inside the uterus on the CT scan which potentially could  represent infection, blood products or fistula - Check pelvic ultrasound - Consider GYN  Admission status: [place], telemetry IV Fluids: LR Diet/Nutrition: Regular Consults called: None DVT Px: SCDs and early ambulation. Code Status: Full Code  Disposition Plan: To home in less than 24 hours  All the records are reviewed and case discussed with ED provider. Management plans discussed with the patient and/or family who express understanding and agree with plan of care.  Khari Lett D.O. on 09/18/2022 at 3:31 AM CC: Primary care physician; Patient, No Pcp Per   09/18/2022, 3:31 AM

## 2022-09-18 NOTE — ED Notes (Signed)
ED TO INPATIENT HANDOFF REPORT  ED Nurse Name and Phone #:  Estee Yohe [phone]  S Name/Age/Gender Kaitlin Lewis 32 y.o. female Room/Bed: 034C/034C  Code Status   Code Status: Full Code  Home/SNF/Other Home Patient oriented to: self, place, time, and situation Is this baseline? Yes   Triage Complete: Triage complete  Chief Complaint Sepsis secondary to UTI (HCC) [A41.9, N39.0]  Triage Note Patient c/o covid symptoms, and possible kidney infection.  Patient endorses cough, chest pain, headache, fevers, and pain with urination.  Patient reports she has had recent covid exposure.    Allergies No Known Allergies  Level of Care/Admitting Diagnosis ED Disposition     ED Disposition  Admit   Condition  --   Comment  Hospital Area: [facility] [[id number]]  Level of Care: Telemetry Medical [104]  May place patient in observation at [facility] or [facility] if equivalent level of care is available:: No  Covid Evaluation: Confirmed COVID Negative  Diagnosis: Sepsis secondary to UTI [facility]) [[id number]]  Admitting Physician: Tonye Royalty [[id number]]  Attending Physician: Tonye Royalty [[id number]]          B Medical/Surgery History Past Medical History:  Diagnosis Date   Migraine    History reviewed. No pertinent surgical history.   A IV Location/Drains/Wounds Patient Lines/Drains/Airways Status     Active Line/Drains/Airways     Name Placement date Placement time Site Days   Peripheral IV 09/17/22 Anterior;Right Antecubital 09/17/22  2318  Antecubital  1            Intake/Output Last 24 hours  Intake/Output Summary (Last 24 hours) at 09/18/2022 0357 Last data filed at 09/18/2022 2376 Gross per 24 hour  Intake 1000 ml  Output --  Net 1000 ml    Labs/Imaging Results for orders placed or performed during the hospital encounter of 09/17/22 (from the past 48 hour(s))  Resp panel by RT-PCR (RSV, Flu A&B, Covid) Anterior Nasal Swab     Status:  None   Collection Time: 09/17/22 10:03 PM   Specimen: Anterior Nasal Swab  Result Value Ref Range   SARS Coronavirus 2 by RT PCR NEGATIVE NEGATIVE   Influenza A by PCR NEGATIVE NEGATIVE   Influenza B by PCR NEGATIVE NEGATIVE    Comment: (NOTE) The Xpert Xpress SARS-CoV-2/FLU/RSV plus assay is intended as an aid in the diagnosis of influenza from Nasopharyngeal swab specimens and should not be used as a sole basis for treatment. Nasal washings and aspirates are unacceptable for Xpert Xpress SARS-CoV-2/FLU/RSV testing.  Fact Sheet for Patients: BloggerCourse.com  Fact Sheet for Healthcare Providers: SeriousBroker.it  This test is not yet approved or cleared by the [place] FDA and has been authorized for detection and/or diagnosis of SARS-CoV-2 by FDA under an Emergency Use Authorization (EUA). This EUA will remain in effect (meaning this test can be used) for the duration of the COVID-19 declaration under Section 564(b)(1) of the Act, 21 U.S.C. section 360bbb-3(b)(1), unless the authorization is terminated or revoked.     Resp Syncytial Virus by PCR NEGATIVE NEGATIVE    Comment: (NOTE) Fact Sheet for Patients: BloggerCourse.com  Fact Sheet for Healthcare Providers: SeriousBroker.it  This test is not yet approved or cleared by the [place] FDA and has been authorized for detection and/or diagnosis of SARS-CoV-2 by FDA under an Emergency Use Authorization (EUA). This EUA will remain in effect (meaning this test can be used) for the duration of the COVID-19 declaration under Section 564(b)(1) of the Act,  21 U.S.C. section 360bbb-3(b)(1), unless the authorization is terminated or revoked.  Performed at [facility], [address], [place], [place] [address]   Urinalysis, Routine w reflex microscopic -Urine, Clean Catch     Status: Abnormal   Collection Time:  09/17/22 10:05 PM  Result Value Ref Range   Color, Urine AMBER (A) YELLOW    Comment: BIOCHEMICALS MAY BE AFFECTED BY COLOR   APPearance CLOUDY (A) CLEAR   Specific Gravity, Urine 1.015 1.005 - 1.030   pH 6.0 5.0 - 8.0   Glucose, UA NEGATIVE NEGATIVE mg/dL   Hgb urine dipstick MODERATE (A) NEGATIVE   Bilirubin Urine NEGATIVE NEGATIVE   Ketones, ur 80 (A) NEGATIVE mg/dL   Protein, ur 191 (A) NEGATIVE mg/dL   Nitrite POSITIVE (A) NEGATIVE   Leukocytes,Ua LARGE (A) NEGATIVE   RBC / HPF 6-10 0 - 5 RBC/hpf   WBC, UA >50 0 - 5 WBC/hpf   Bacteria, UA MANY (A) NONE SEEN   Squamous Epithelial / HPF 0-5 0 - 5 /HPF    Comment: Performed at [facility], [address], [place], [place] [address]  Pregnancy, urine     Status: None   Collection Time: 09/17/22 10:05 PM  Result Value Ref Range   Preg Test, Ur NEGATIVE NEGATIVE    Comment:        THE SENSITIVITY OF THIS METHODOLOGY IS >25 mIU/mL. Performed at [facility], [address] [place], [place] [address]   CBC with Differential     Status: Abnormal   Collection Time: 09/17/22 10:05 PM  Result Value Ref Range   WBC 13.2 (H) 4.0 - 10.5 K/uL   RBC 4.06 3.87 - 5.11 MIL/uL   Hemoglobin 12.6 12.0 - 15.0 g/dL   HCT 08.6 57.8 - 46.9 %   MCV 94.1 80.0 - 100.0 fL   MCH 31.0 26.0 - 34.0 pg   MCHC 33.0 30.0 - 36.0 g/dL   RDW 62.9 52.8 - 41.3 %   Platelets 408 (H) 150 - 400 K/uL   nRBC 0.0 0.0 - 0.2 %   Neutrophils Relative % 82 %   Neutro Abs 10.7 (H) 1.7 - 7.7 K/uL   Lymphocytes Relative 9 %   Lymphs Abs 1.2 0.7 - 4.0 K/uL   Monocytes Relative 9 %   Monocytes Absolute 1.2 (H) 0.1 - 1.0 K/uL   Eosinophils Relative 0 %   Eosinophils Absolute 0.0 0.0 - 0.5 K/uL   Basophils Relative 0 %   Basophils Absolute 0.0 0.0 - 0.1 K/uL   Immature Granulocytes 0 %   Abs Immature Granulocytes 0.05 0.00 - 0.07 K/uL    Comment: Performed at [facility], [address], [place], [place] [address]  Comprehensive metabolic panel      Status: Abnormal   Collection Time: 09/17/22 10:05 PM  Result Value Ref Range   Sodium 130 (L) 135 - 145 mmol/L   Potassium 3.3 (L) 3.5 - 5.1 mmol/L   Chloride 87 (L) 98 - 111 mmol/L   CO2 26 22 - 32 mmol/L   Glucose, Bld 104 (H) 70 - 99 mg/dL    Comment: Glucose reference range applies only to samples taken after fasting for at least 8 hours.   BUN 9 6 - 20 mg/dL   Creatinine, Ser 0.27 0.44 - 1.00 mg/dL   Calcium 9.2 8.9 - 25.3 mg/dL   Total Protein 8.2 (H) 6.5 - 8.1 g/dL   Albumin 4.2 3.5 -  5.0 g/dL   AST 40 15 - 41 U/L   ALT 27 0 - 44 U/L   Alkaline Phosphatase 69 38 - 126 U/L   Total Bilirubin 1.5 (H) 0.3 - 1.2 mg/dL   GFR, Estimated >10 >27 mL/min    Comment: (NOTE) Calculated using the CKD-EPI Creatinine Equation (2021)    Anion gap 17 (H) 5 - 15    Comment: Performed at [facility], [address] [place], [place] [address]  Troponin I (High Sensitivity)     Status: None   Collection Time: 09/17/22 10:05 PM  Result Value Ref Range   Troponin I (High Sensitivity) 4 <18 ng/L    Comment: (NOTE) Elevated high sensitivity troponin I (hsTnI) values and significant  changes across serial measurements may suggest ACS but many other  chronic and acute conditions are known to elevate hsTnI results.  Refer to the "Links" section for chest pain algorithms and additional  guidance. Performed at [facility], [address], [place], [place] [address]   I-Stat Lactic Acid, ED     Status: None   Collection Time: 09/17/22 11:21 PM  Result Value Ref Range   Lactic Acid, Venous 1.1 0.5 - 1.9 mmol/L   CT ABDOMEN PELVIS W CONTRAST  Result Date: 09/17/2022 CLINICAL DATA:  Bilateral flank pain EXAM: CT ABDOMEN AND PELVIS WITH CONTRAST TECHNIQUE: Multidetector CT imaging of the abdomen and pelvis was performed using the standard protocol following bolus administration of intravenous contrast. RADIATION DOSE REDUCTION: This exam was performed according to the departmental  dose-optimization program which includes automated exposure control, adjustment of the mA and/or kV according to patient size and/or use of iterative reconstruction technique. CONTRAST:  75mL OMNIPAQUE IOHEXOL 350 MG/ML SOLN COMPARISON:  None 04/11/2019 FINDINGS: Lower chest: Lung bases are clear. Hepatobiliary: Mild diffuse fatty infiltration of the liver. No focal lesions. Gallbladder and bile ducts are normal. Pancreas: Unremarkable. No pancreatic ductal dilatation or surrounding inflammatory changes. Spleen: Normal in size without focal abnormality. Adrenals/Urinary Tract: Nephrograms are symmetrical. No hydronephrosis or hydroureter. The bladder wall and ureteral walls as well as the renal collecting systems bilaterally demonstrate diffuse wall thickening and hyperemia. This likely indicates cystitis and pyelonephritis. No abscess. Stomach/Bowel: Stomach, small bowel, and colon are not abnormally distended. No wall thickening or inflammatory changes. Appendix is normal. Vascular/Lymphatic: No significant vascular findings are present. No enlarged abdominal or pelvic lymph nodes. Reproductive: Uterus and ovaries are not enlarged. Intravaginal debris is suggested, possibly indicating infection. Blood products or even a fistula could also produce this appearance. Correlate with physical examination. Other: No abdominal wall hernia or abnormality. No abdominopelvic ascites. Musculoskeletal: No acute or significant osseous findings. IMPRESSION: 1. Wall thickening involving the bladder, ureters, and renal collecting systems suggesting cystitis and pyelonephritis. No abscess. 2. Debris demonstrated inside the vagina possibly representing infection, blood products, or fistula. Correlate with physical examination. 3. Mild diffuse fatty infiltration of the liver. Electronically Signed   By: Burman Nieves M.D.   On: 09/17/2022 23:45   DG Chest 2 View  Result Date: 09/17/2022 CLINICAL DATA:  Cough EXAM: CHEST - 2  VIEW COMPARISON:  06/24/2020 FINDINGS: The heart size and mediastinal contours are within normal limits. Both lungs are clear. The visualized skeletal structures are unremarkable. IMPRESSION: No active cardiopulmonary disease. Electronically Signed   By: Alcide Clever M.D.   On: 09/17/2022 22:27    Pending Labs [employer] (From admission, onward)     Start  Ordered   09/18/22 0043  Urine Culture  Once,   URGENT       Question:  Indication  Answer:  Dysuria   09/18/22 0044   09/17/22 2247  Blood Culture (routine x 2)  (Undifferentiated presentation (screening labs and basic nursing orders))  BLOOD CULTURE X 2,   STAT      09/17/22 2248   Signed and Held  HIV Antibody (routine testing w rflx)  (HIV Antibody (Routine testing w reflex) panel)  Once,   R        Signed and Held   Signed and Held  Comprehensive metabolic panel  Tomorrow morning,   R        Signed and Held   Signed and Held  CBC  Tomorrow morning,   R        Signed and Held            Vitals/Pain Today's Vitals   09/18/22 0250 09/18/22 0250 09/18/22 0330 09/18/22 0345  BP:   118/85   Pulse: (!) 107  (!) 105 100  Resp:    14  Temp:  99.8 F (37.7 C)    TempSrc:  Oral    SpO2:   99% 100%  Weight:      PainSc:  10-Worst pain ever      Isolation Precautions No active isolations  Medications Medications  lactated ringers infusion ( Intravenous New Bag/Given 09/18/22 0008)  cefTRIAXone (ROCEPHIN) 2 g in sodium chloride 0.9 % 100 mL IVPB (0 g Intravenous Stopped 09/18/22 0009)  lactated ringers bolus 1,000 mL (1,000 mLs Intravenous Not Given 09/18/22 0010)    And  lactated ringers bolus 500 mL (500 mLs Intravenous Not Given 09/18/22 0236)    And  lactated ringers bolus 250 mL (250 mLs Intravenous Not Given 09/18/22 0238)  lactated ringers bolus 1,000 mL (0 mLs Intravenous Stopped 09/18/22 0239)  ondansetron (ZOFRAN) injection 4 mg (4 mg Intravenous Given 09/17/22 2323)  iohexol (OMNIPAQUE) 350 MG/ML injection 75  mL (75 mLs Intravenous Contrast Given 09/17/22 2337)  fentaNYL (SUBLIMAZE) injection 50 mcg (50 mcg Intravenous Given 09/18/22 0251)    Mobility walks      R Recommendations: See Admitting Provider Note  Report given to:   Additional Notes:  Pt is A&Ox4, continent x2, ambulatory.

## 2022-09-18 NOTE — Plan of Care (Signed)
  Problem: Clinical Measurements: Goal: Diagnostic test results will improve Outcome: Progressing Goal: Signs and symptoms of infection will decrease Outcome: Progressing

## 2022-09-19 DIAGNOSIS — N390 Urinary tract infection, site not specified: Secondary | ICD-10-CM | POA: Diagnosis not present

## 2022-09-19 DIAGNOSIS — E871 Hypo-osmolality and hyponatremia: Secondary | ICD-10-CM | POA: Diagnosis present

## 2022-09-19 DIAGNOSIS — Z8051 Family history of malignant neoplasm of kidney: Secondary | ICD-10-CM | POA: Diagnosis not present

## 2022-09-19 DIAGNOSIS — N12 Tubulo-interstitial nephritis, not specified as acute or chronic: Secondary | ICD-10-CM | POA: Diagnosis present

## 2022-09-19 DIAGNOSIS — B9620 Unspecified Escherichia coli [E. coli] as the cause of diseases classified elsewhere: Secondary | ICD-10-CM | POA: Diagnosis present

## 2022-09-19 DIAGNOSIS — N3090 Cystitis, unspecified without hematuria: Secondary | ICD-10-CM | POA: Diagnosis present

## 2022-09-19 DIAGNOSIS — Z20822 Contact with and (suspected) exposure to covid-19: Secondary | ICD-10-CM | POA: Diagnosis present

## 2022-09-19 DIAGNOSIS — Z87440 Personal history of urinary (tract) infections: Secondary | ICD-10-CM | POA: Diagnosis not present

## 2022-09-19 DIAGNOSIS — Z1152 Encounter for screening for COVID-19: Secondary | ICD-10-CM | POA: Diagnosis not present

## 2022-09-19 DIAGNOSIS — N10 Acute tubulo-interstitial nephritis: Secondary | ICD-10-CM | POA: Diagnosis present

## 2022-09-19 DIAGNOSIS — Z87891 Personal history of nicotine dependence: Secondary | ICD-10-CM | POA: Diagnosis not present

## 2022-09-19 DIAGNOSIS — F1090 Alcohol use, unspecified, uncomplicated: Secondary | ICD-10-CM | POA: Diagnosis present

## 2022-09-19 DIAGNOSIS — A419 Sepsis, unspecified organism: Secondary | ICD-10-CM | POA: Diagnosis present

## 2022-09-19 DIAGNOSIS — K760 Fatty (change of) liver, not elsewhere classified: Secondary | ICD-10-CM | POA: Diagnosis present

## 2022-09-19 DIAGNOSIS — E876 Hypokalemia: Secondary | ICD-10-CM | POA: Diagnosis present

## 2022-09-19 LAB — CBC
HCT: 31 % — ABNORMAL LOW (ref 36.0–46.0)
Hemoglobin: 10 g/dL — ABNORMAL LOW (ref 12.0–15.0)
MCH: 31 pg (ref 26.0–34.0)
MCHC: 32.3 g/dL (ref 30.0–36.0)
MCV: 96 fL (ref 80.0–100.0)
Platelets: 252 10*3/uL (ref 150–400)
RBC: 3.23 MIL/uL — ABNORMAL LOW (ref 3.87–5.11)
RDW: 13.1 % (ref 11.5–15.5)
WBC: 6 10*3/uL (ref 4.0–10.5)
nRBC: 0 % (ref 0.0–0.2)

## 2022-09-19 LAB — BASIC METABOLIC PANEL
Anion gap: 10 (ref 5–15)
BUN: <5 mg/dL — ABNORMAL LOW (ref 6–20)
CO2: 27 mmol/L (ref 22–32)
Calcium: 8.4 mg/dL — ABNORMAL LOW (ref 8.9–10.3)
Chloride: 99 mmol/L (ref 98–111)
Creatinine, Ser: 0.65 mg/dL (ref 0.44–1.00)
GFR, Estimated: >60 mL/min (ref >60)
Glucose, Bld: 101 mg/dL — ABNORMAL HIGH (ref 70–99)
Potassium: 3.7 mmol/L (ref 3.5–5.1)
Sodium: 136 mmol/L (ref 135–145)

## 2022-09-19 MED ORDER — ADULT MULTIVITAMIN W/MINERALS CH
1.0000 | ORAL_TABLET | Freq: Every day | ORAL | Status: DC
  Administered 2022-09-19 – 2022-09-20 (×2): 1 via ORAL
  Filled 2022-09-19 (×2): qty 1

## 2022-09-19 MED ORDER — LORAZEPAM 2 MG/ML IJ SOLN: 1.0000 mg | INTRAMUSCULAR | Status: DC | PRN

## 2022-09-19 MED ORDER — THIAMINE MONONITRATE 100 MG PO TABS
100.0000 mg | ORAL_TABLET | Freq: Every day | ORAL | Status: DC
  Administered 2022-09-19 – 2022-09-20 (×2): 100 mg via ORAL
  Filled 2022-09-19 (×2): qty 1

## 2022-09-19 MED ORDER — THIAMINE HCL 100 MG/ML IJ SOLN
100.0000 mg | Freq: Every day | INTRAMUSCULAR | Status: DC
  Filled 2022-09-19: qty 2

## 2022-09-19 MED ORDER — SODIUM CHLORIDE 0.9 % IV SOLN: INTRAVENOUS | Status: DC

## 2022-09-19 MED ORDER — LORAZEPAM 1 MG PO TABS: 1.0000 mg | ORAL_TABLET | ORAL | Status: DC | PRN

## 2022-09-19 MED ORDER — FOLIC ACID 1 MG PO TABS
1.0000 mg | ORAL_TABLET | Freq: Every day | ORAL | Status: DC
  Administered 2022-09-19 – 2022-09-20 (×2): 1 mg via ORAL
  Filled 2022-09-19 (×2): qty 1

## 2022-09-19 NOTE — Plan of Care (Signed)
  Problem: Clinical Measurements: Goal: Diagnostic test results will improve Outcome: Progressing   

## 2022-09-19 NOTE — Progress Notes (Signed)
PROGRESS NOTE    Kaitlin Lewis  NGE:[id number] DOB: May 15, 1990 DOA: 09/17/2022 PCP: Patient, No Pcp Per    Brief Narrative:  32 year old with history of migraine headaches but no other significant medical issues presented with left flank pain, fever chills and dysuria for 2 weeks, not relieved with taking Azo as an outpatient.  Does have a history of previous UTIs.  In the emergency room, significant pain, febrile 103.  Admitted with acute pyelonephritis.   Assessment & Plan:   Sepsis present on admission secondary to pyonephritis and cystitis: Patient still spiking fever.  Continue monitoring in the hospital. Continue maintenance IV fluids, continue Rocephin. Blood cultures negative so far.  Urine culture growing more than 100,000 colonies of gram-negative. CT scan with no evidence of hydronephrosis or obstructive stone.    Alcohol use: Patient with significant beer drinking.  Currently no evidence of alcohol withdrawal syndrome.  Will keep on CIWA scale with benzodiazepines as needed and thiamine and multivitamins.  Fall precautions.  Hypokalemia: Replaced.  Abdominal pelvic CT scan: Repeat ultrasound showed no evidence of abscess or blood products in the pelvis.  DVT prophylaxis: SCDs Start: 09/18/22 0520   Code Status: Full code Family Communication: None at bedside Disposition Plan: Status is: Observation The patient will require care spanning > 2 midnights and should be moved to inpatient because: Persistent fever, IV antibiotics     Consultants:  None  Procedures:  None  Antimicrobials:  Rocephin 8/27---   Subjective: Patient seen and examined.  She is still having low-grade fever.  Persistent flank pain but slightly better than before.  Denies any nausea or vomiting.  Appetite is poor.  Objective: Vitals:   09/19/22 0023 09/19/22 0537 09/19/22 0759 09/19/22 1102  BP: 105/73 117/79 107/73 103/71  Pulse: 98 98 (!) 106 96  Resp: 16 16 15 18   Temp: 99.6 F  (37.6 C) 99.5 F (37.5 C) 100.2 F (37.9 C) 98.6 F (37 C)  TempSrc: Oral Oral Oral Oral  SpO2: 98% 99% 99% 97%  Weight:      Height:        Intake/Output Summary (Last 24 hours) at 09/19/2022 1119 Last data filed at 09/18/2022 2300 Gross per 24 hour  Intake 1206.83 ml  Output --  Net 1206.83 ml   Filed Weights   09/17/22 2200 09/18/22 0520  Weight: 53.1 kg 53.1 kg    Examination:  General exam: Appears calm and comfortable  Febrile.  Tachycardic.  Alert awake and oriented.  Not in any distress. Respiratory system: Clear to auscultation. Respiratory effort normal. Cardiovascular system: S1 & S2 heard, regular rate rhythm.  Tachycardic. Gastrointestinal system: Nontender.  Bowel sound present. Central nervous system: Alert and oriented. No focal neurological deficits. Extremities: Symmetric 5 x 5 power.     Data Reviewed: I have personally reviewed following labs and imaging studies  CBC: Recent Labs  Lab 09/17/22 2205 09/18/22 0815 09/19/22 0410  WBC 13.2* 9.6 6.0  NEUTROABS 10.7*  --   --   HGB 12.6 10.3* 10.0*  HCT 38.2 31.2* 31.0*  MCV 94.1 94.5 96.0  PLT 408* 266 252   Basic Metabolic Panel: Recent Labs  Lab 09/17/22 2205 09/18/22 0815 09/19/22 0410  NA 130* 132* 136  K 3.3* 3.9 3.7  CL 87* 96* 99  CO2 26 23 27   GLUCOSE 104* 124* 101*  BUN 9 5* <5*  CREATININE 0.98 0.84 0.65  CALCIUM 9.2 8.3* 8.4*   GFR: Estimated Creatinine Clearance: 72.5 mL/min (by C-G  formula based on SCr of 0.65 mg/dL). Liver Function Tests: Recent Labs  Lab 09/17/22 2205 09/18/22 0815  AST 40 27  ALT 27 18  ALKPHOS 69 53  BILITOT 1.5* 1.0  PROT 8.2* 6.2*  ALBUMIN 4.2 3.1*   No results for input(s): "LIPASE", "AMYLASE" in the last 168 hours. No results for input(s): "AMMONIA" in the last 168 hours. Coagulation Profile: No results for input(s): "INR", "PROTIME" in the last 168 hours. Cardiac Enzymes: No results for input(s): "CKTOTAL", "CKMB", "CKMBINDEX",  "TROPONINI" in the last 168 hours. BNP (last 3 results) No results for input(s): "PROBNP" in the last 8760 hours. HbA1C: No results for input(s): "HGBA1C" in the last 72 hours. CBG: No results for input(s): "GLUCAP" in the last 168 hours. Lipid Profile: No results for input(s): "CHOL", "HDL", "LDLCALC", "TRIG", "CHOLHDL", "LDLDIRECT" in the last 72 hours. Thyroid Function Tests: No results for input(s): "TSH", "T4TOTAL", "FREET4", "T3FREE", "THYROIDAB" in the last 72 hours. Anemia Panel: No results for input(s): "VITAMINB12", "FOLATE", "FERRITIN", "TIBC", "IRON", "RETICCTPCT" in the last 72 hours. Sepsis Labs: Recent Labs  Lab 09/17/22 2321  LATICACIDVEN 1.1    Recent Results (from the past 240 hour(s))  Resp panel by RT-PCR (RSV, Flu A&B, Covid) Anterior Nasal Swab     Status: None   Collection Time: 09/17/22 10:03 PM   Specimen: Anterior Nasal Swab  Result Value Ref Range Status   SARS Coronavirus 2 by RT PCR NEGATIVE NEGATIVE Final   Influenza A by PCR NEGATIVE NEGATIVE Final   Influenza B by PCR NEGATIVE NEGATIVE Final    Comment: (NOTE) The Xpert Xpress SARS-CoV-2/FLU/RSV plus assay is intended as an aid in the diagnosis of influenza from Nasopharyngeal swab specimens and should not be used as a sole basis for treatment. Nasal washings and aspirates are unacceptable for Xpert Xpress SARS-CoV-2/FLU/RSV testing.  Fact Sheet for Patients: BloggerCourse.com  Fact Sheet for Healthcare Providers: SeriousBroker.it  This test is not yet approved or cleared by the [place] FDA and has been authorized for detection and/or diagnosis of SARS-CoV-2 by FDA under an Emergency Use Authorization (EUA). This EUA will remain in effect (meaning this test can be used) for the duration of the COVID-19 declaration under Section 564(b)(1) of the Act, 21 U.S.C. section 360bbb-3(b)(1), unless the authorization is terminated  or revoked.     Resp Syncytial Virus by PCR NEGATIVE NEGATIVE Final    Comment: (NOTE) Fact Sheet for Patients: BloggerCourse.com  Fact Sheet for Healthcare Providers: SeriousBroker.it  This test is not yet approved or cleared by the [place] FDA and has been authorized for detection and/or diagnosis of SARS-CoV-2 by FDA under an Emergency Use Authorization (EUA). This EUA will remain in effect (meaning this test can be used) for the duration of the COVID-19 declaration under Section 564(b)(1) of the Act, 21 U.S.C. section 360bbb-3(b)(1), unless the authorization is terminated or revoked.  Performed at [facility], [address], [place], [place] [address]   Urine Culture     Status: Abnormal (Preliminary result)   Collection Time: 09/17/22 10:05 PM   Specimen: Urine, Clean Catch  Result Value Ref Range Status   Specimen Description URINE, CLEAN CATCH  Final   Special Requests NONE  Final   Culture (A)  Final    >=100,000 COLONIES/mL ESCHERICHIA COLI SUSCEPTIBILITIES TO FOLLOW Performed at [facility], [address] [place], [place] [address]    Report Status PENDING  Incomplete  Blood Culture (routine x 2)  Status: None (Preliminary result)   Collection Time: 09/17/22 11:11 PM   Specimen: BLOOD RIGHT ARM  Result Value Ref Range Status   Specimen Description BLOOD RIGHT ARM  Final   Special Requests   Final    BOTTLES DRAWN AEROBIC AND ANAEROBIC Blood Culture adequate volume   Culture   Final    NO GROWTH 2 DAYS Performed at [facility], [address], [place], [place] [address]    Report Status PENDING  Incomplete  Blood Culture (routine x 2)     Status: None (Preliminary result)   Collection Time: 09/17/22 11:12 PM   Specimen: BLOOD LEFT ARM  Result Value Ref Range Status   Specimen Description BLOOD LEFT ARM  Final   Special Requests   Final    BOTTLES DRAWN AEROBIC AND ANAEROBIC  Blood Culture adequate volume   Culture   Final    NO GROWTH 2 DAYS Performed at [facility], [address] [place], [place] [address]    Report Status PENDING  Incomplete         Radiology Studies: US PELVIC COMPLETE WITH TRANSVAGINAL  Result Date: 09/18/2022 CLINICAL DATA:  Abnormal abdominal CT EXAM: TRANSABDOMINAL AND TRANSVAGINAL ULTRASOUND OF PELVIS TECHNIQUE: Both transabdominal and transvaginal ultrasound examinations of the pelvis were performed. Transabdominal technique was performed for global imaging of the pelvis including uterus, ovaries, adnexal regions, and pelvic cul-de-sac. It was necessary to proceed with endovaginal exam following the transabdominal exam to visualize the ovaries and endometrium. COMPARISON:  Abdominal CT from yesterday FINDINGS: Uterus Measurements: 6 x 3 x 4 cm = volume: 35 mL. No fibroids or other mass visualized. Endometrium Thickness: 3 mm.  No focal abnormality visualized. Right ovary Measurements: 36 x 18 x 36 mm = volume: 12.5 mL. Normal appearance/no adnexal mass. Left ovary Measurements: 25 x 15 x 37 mm = volume: 7.1 mL. Normal appearance/no adnexal mass. Other findings No abnormal free fluid. Small volume debris in the urinary bladder correlating with prior CT findings. IMPRESSION: Normal pelvic ultrasound. Electronically Signed   By: Tiburcio Pea M.D.   On: 09/18/2022 07:00   CT ABDOMEN PELVIS W CONTRAST  Result Date: 09/17/2022 CLINICAL DATA:  Bilateral flank pain EXAM: CT ABDOMEN AND PELVIS WITH CONTRAST TECHNIQUE: Multidetector CT imaging of the abdomen and pelvis was performed using the standard protocol following bolus administration of intravenous contrast. RADIATION DOSE REDUCTION: This exam was performed according to the departmental dose-optimization program which includes automated exposure control, adjustment of the mA and/or kV according to patient size and/or use of iterative reconstruction technique. CONTRAST:  75mL  OMNIPAQUE IOHEXOL 350 MG/ML SOLN COMPARISON:  None 04/11/2019 FINDINGS: Lower chest: Lung bases are clear. Hepatobiliary: Mild diffuse fatty infiltration of the liver. No focal lesions. Gallbladder and bile ducts are normal. Pancreas: Unremarkable. No pancreatic ductal dilatation or surrounding inflammatory changes. Spleen: Normal in size without focal abnormality. Adrenals/Urinary Tract: Nephrograms are symmetrical. No hydronephrosis or hydroureter. The bladder wall and ureteral walls as well as the renal collecting systems bilaterally demonstrate diffuse wall thickening and hyperemia. This likely indicates cystitis and pyelonephritis. No abscess. Stomach/Bowel: Stomach, small bowel, and colon are not abnormally distended. No wall thickening or inflammatory changes. Appendix is normal. Vascular/Lymphatic: No significant vascular findings are present. No enlarged abdominal or pelvic lymph nodes. Reproductive: Uterus and ovaries are not enlarged. Intravaginal debris is suggested, possibly indicating infection. Blood products or even a fistula could also produce this appearance. Correlate with physical examination. Other: No abdominal  wall hernia or abnormality. No abdominopelvic ascites. Musculoskeletal: No acute or significant osseous findings. IMPRESSION: 1. Wall thickening involving the bladder, ureters, and renal collecting systems suggesting cystitis and pyelonephritis. No abscess. 2. Debris demonstrated inside the vagina possibly representing infection, blood products, or fistula. Correlate with physical examination. 3. Mild diffuse fatty infiltration of the liver. Electronically Signed   By: Burman Nieves M.D.   On: 09/17/2022 23:45   DG Chest 2 View  Result Date: 09/17/2022 CLINICAL DATA:  Cough EXAM: CHEST - 2 VIEW COMPARISON:  06/24/2020 FINDINGS: The heart size and mediastinal contours are within normal limits. Both lungs are clear. The visualized skeletal structures are unremarkable. IMPRESSION: No  active cardiopulmonary disease. Electronically Signed   By: Alcide Clever M.D.   On: 09/17/2022 22:27        Scheduled Meds:  folic acid  1 mg Oral Daily   multivitamin with minerals  1 tablet Oral Daily   phenazopyridine  100 mg Oral TID WC   sodium chloride flush  3 mL Intravenous Q12H   thiamine  100 mg Oral Daily   Or   thiamine  100 mg Intravenous Daily   Continuous Infusions:  cefTRIAXone (ROCEPHIN)  IV 2 g (09/18/22 2130)     LOS: 0 days    Time spent: 35 minutes    Dorcas Carrow, MD [facility]

## 2022-09-20 DIAGNOSIS — N390 Urinary tract infection, site not specified: Secondary | ICD-10-CM | POA: Diagnosis not present

## 2022-09-20 DIAGNOSIS — A419 Sepsis, unspecified organism: Secondary | ICD-10-CM | POA: Diagnosis not present

## 2022-09-20 LAB — URINE CULTURE: Culture: >=100000 — AB

## 2022-09-20 MED ORDER — SODIUM CHLORIDE 0.9 % IV SOLN
2.0000 g | Freq: Once | INTRAVENOUS | Status: AC
  Administered 2022-09-20: 2 g via INTRAVENOUS
  Filled 2022-09-20: qty 20

## 2022-09-20 MED ORDER — CEFDINIR 300 MG PO CAPS: 300.0000 mg | ORAL_CAPSULE | Freq: Two times a day (BID) | ORAL | 0 refills | Status: AC

## 2022-09-20 NOTE — Progress Notes (Signed)
Discharge instructions given to patient. Pop up on Epic stating she had home meds in pharmacy. Both pharmacy and patient confirmed she does not have any in their storage. IV removed from pt. No questions at this time.

## 2022-09-20 NOTE — Discharge Summary (Signed)
Physician Discharge Summary  Kaitlin Lewis ZOX:[id number] DOB: 02/10/1990 DOA: 09/17/2022  PCP: Patient, No Pcp Per  Admit date: 09/17/2022 Discharge date: 09/20/2022  Admitted From: Home Disposition: Home  Recommendations for Outpatient Follow-up:  Follow up with PCP in 1-2 weeks  Discharge Condition: Stable CODE STATUS: Full code Diet recommendation: Regular diet  Discharge summary: 32 year old with history of migraine headache presented with 2 days of left flank pain fever and chills along with dysuria not relieved with Azo as outpatient.  In the emergency room significant flank pain, febrile with temperature 103.  CT scan with no evidence of hydronephrosis or obstructive stone, however with evidence of inflammation along the ureter and pelvis.  Due to significant symptoms, she was admitted to the hospital, treated with IV fluids and IV antibiotics namely Rocephin.  Day 3 of Rocephin today.  Blood cultures are negative.  Urine culture with more than 100,000 colonies of E. coli sensitive to cephalosporins.  Temperature improved since last 24 hours.  Clinically stabilizing.  With clinical improvement, she will be going home and taking another 6 days of cefdinir.  Patient also reported consumption of beer every day, there was no evidence of withdrawal syndrome however she was educated and counseled to quit drinking alcohol.  Discharge Diagnoses:  Principal Problem:   Sepsis secondary to UTI [facility]) Active Problems:   Sepsis [facility])   Pyelonephritis    Discharge Instructions  Discharge Instructions     Diet general   Complete by: As directed    Increase activity slowly   Complete by: As directed       Allergies as of 09/20/2022   No Known Allergies      Medication List     TAKE these medications    cefdinir 300 MG capsule Commonly known as: OMNICEF Take 1 capsule (300 mg total) by mouth 2 (two) times daily for 6 days.   gabapentin 100 MG capsule Commonly known as:  NEURONTIN Take 100 mg by mouth 3 (three) times daily as needed (sciatic nerve pain).   rizatriptan 5 MG tablet Commonly known as: MAXALT Take 5 mg by mouth daily as needed for migraine.        Follow-up Information     Practice, [facility]. Schedule an appointment as soon as possible for a visit.   Why: Call the office and schedule a hospital follow up in the next 7-10 days. Contact information: [address] [facility] [address] [place] [address] [phone]                No Known Allergies  Consultations: None   Procedures/Studies: US PELVIC COMPLETE WITH TRANSVAGINAL  Result Date: 09/18/2022 CLINICAL DATA:  Abnormal abdominal CT EXAM: TRANSABDOMINAL AND TRANSVAGINAL ULTRASOUND OF PELVIS TECHNIQUE: Both transabdominal and transvaginal ultrasound examinations of the pelvis were performed. Transabdominal technique was performed for global imaging of the pelvis including uterus, ovaries, adnexal regions, and pelvic cul-de-sac. It was necessary to proceed with endovaginal exam following the transabdominal exam to visualize the ovaries and endometrium. COMPARISON:  Abdominal CT from yesterday FINDINGS: Uterus Measurements: 6 x 3 x 4 cm = volume: 35 mL. No fibroids or other mass visualized. Endometrium Thickness: 3 mm.  No focal abnormality visualized. Right ovary Measurements: 36 x 18 x 36 mm = volume: 12.5 mL. Normal appearance/no adnexal mass. Left ovary Measurements: 25 x 15 x 37 mm = volume: 7.1 mL. Normal appearance/no adnexal mass. Other findings No abnormal free fluid. Small volume debris in the  urinary bladder correlating with prior CT findings. IMPRESSION: Normal pelvic ultrasound. Electronically Signed   By: Tiburcio Pea M.D.   On: 09/18/2022 07:00   CT ABDOMEN PELVIS W CONTRAST  Result Date: 09/17/2022 CLINICAL DATA:  Bilateral flank pain EXAM: CT ABDOMEN AND PELVIS WITH CONTRAST TECHNIQUE: Multidetector CT imaging of the abdomen and pelvis was  performed using the standard protocol following bolus administration of intravenous contrast. RADIATION DOSE REDUCTION: This exam was performed according to the departmental dose-optimization program which includes automated exposure control, adjustment of the mA and/or kV according to patient size and/or use of iterative reconstruction technique. CONTRAST:  75mL OMNIPAQUE IOHEXOL 350 MG/ML SOLN COMPARISON:  None 04/11/2019 FINDINGS: Lower chest: Lung bases are clear. Hepatobiliary: Mild diffuse fatty infiltration of the liver. No focal lesions. Gallbladder and bile ducts are normal. Pancreas: Unremarkable. No pancreatic ductal dilatation or surrounding inflammatory changes. Spleen: Normal in size without focal abnormality. Adrenals/Urinary Tract: Nephrograms are symmetrical. No hydronephrosis or hydroureter. The bladder wall and ureteral walls as well as the renal collecting systems bilaterally demonstrate diffuse wall thickening and hyperemia. This likely indicates cystitis and pyelonephritis. No abscess. Stomach/Bowel: Stomach, small bowel, and colon are not abnormally distended. No wall thickening or inflammatory changes. Appendix is normal. Vascular/Lymphatic: No significant vascular findings are present. No enlarged abdominal or pelvic lymph nodes. Reproductive: Uterus and ovaries are not enlarged. Intravaginal debris is suggested, possibly indicating infection. Blood products or even a fistula could also produce this appearance. Correlate with physical examination. Other: No abdominal wall hernia or abnormality. No abdominopelvic ascites. Musculoskeletal: No acute or significant osseous findings. IMPRESSION: 1. Wall thickening involving the bladder, ureters, and renal collecting systems suggesting cystitis and pyelonephritis. No abscess. 2. Debris demonstrated inside the vagina possibly representing infection, blood products, or fistula. Correlate with physical examination. 3. Mild diffuse fatty infiltration  of the liver. Electronically Signed   By: Burman Nieves M.D.   On: 09/17/2022 23:45   DG Chest 2 View  Result Date: 09/17/2022 CLINICAL DATA:  Cough EXAM: CHEST - 2 VIEW COMPARISON:  06/24/2020 FINDINGS: The heart size and mediastinal contours are within normal limits. Both lungs are clear. The visualized skeletal structures are unremarkable. IMPRESSION: No active cardiopulmonary disease. Electronically Signed   By: Alcide Clever M.D.   On: 09/17/2022 22:27   (Echo, Carotid, EGD, Colonoscopy, ERCP)    Subjective: Patient seen and examined.  Mild right flank pain present, however much improved than before. Temperature mostly less than 100 over last 24 hours.  Denies any nausea vomiting.  Tolerating regular diet.   Discharge Exam: Vitals:   09/20/22 0017 09/20/22 0534  BP: 103/74 105/89  Pulse: 83 (!) 103  Resp: 16   Temp: 98.7 F (37.1 C) 100.3 F (37.9 C)  SpO2: 100% 100%   Vitals:   09/19/22 1744 09/19/22 2007 09/20/22 0017 09/20/22 0534  BP: 116/78 105/76 103/74 105/89  Pulse: (!) 108 95 83 (!) 103  Resp: 18 16 16    Temp: 99.2 F (37.3 C) 99.9 F (37.7 C) 98.7 F (37.1 C) 100.3 F (37.9 C)  TempSrc:  Oral Oral Oral  SpO2: 97% 97% 100% 100%  Weight:      Height:        General: Pt is alert, awake, not in acute distress Cardiovascular: RRR, S1/S2 +, no rubs, no gallops Respiratory: CTA bilaterally, no wheezing, no rhonchi Abdominal: Soft, NT, ND, bowel sounds + Extremities: no edema, no cyanosis    The results of significant diagnostics from this  hospitalization (including imaging, microbiology, ancillary and laboratory) are listed below for reference.     Microbiology: Recent Results (from the past 240 hour(s))  Resp panel by RT-PCR (RSV, Flu A&B, Covid) Anterior Nasal Swab     Status: None   Collection Time: 09/17/22 10:03 PM   Specimen: Anterior Nasal Swab  Result Value Ref Range Status   SARS Coronavirus 2 by RT PCR NEGATIVE NEGATIVE Final   Influenza A  by PCR NEGATIVE NEGATIVE Final   Influenza B by PCR NEGATIVE NEGATIVE Final    Comment: (NOTE) The Xpert Xpress SARS-CoV-2/FLU/RSV plus assay is intended as an aid in the diagnosis of influenza from Nasopharyngeal swab specimens and should not be used as a sole basis for treatment. Nasal washings and aspirates are unacceptable for Xpert Xpress SARS-CoV-2/FLU/RSV testing.  Fact Sheet for Patients: BloggerCourse.com  Fact Sheet for Healthcare Providers: SeriousBroker.it  This test is not yet approved or cleared by the [place] FDA and has been authorized for detection and/or diagnosis of SARS-CoV-2 by FDA under an Emergency Use Authorization (EUA). This EUA will remain in effect (meaning this test can be used) for the duration of the COVID-19 declaration under Section 564(b)(1) of the Act, 21 U.S.C. section 360bbb-3(b)(1), unless the authorization is terminated or revoked.     Resp Syncytial Virus by PCR NEGATIVE NEGATIVE Final    Comment: (NOTE) Fact Sheet for Patients: BloggerCourse.com  Fact Sheet for Healthcare Providers: SeriousBroker.it  This test is not yet approved or cleared by the [place] FDA and has been authorized for detection and/or diagnosis of SARS-CoV-2 by FDA under an Emergency Use Authorization (EUA). This EUA will remain in effect (meaning this test can be used) for the duration of the COVID-19 declaration under Section 564(b)(1) of the Act, 21 U.S.C. section 360bbb-3(b)(1), unless the authorization is terminated or revoked.  Performed at [facility], [address] [place], [place] [address]   Urine Culture     Status: Abnormal   Collection Time: 09/17/22 10:05 PM   Specimen: Urine, Clean Catch  Result Value Ref Range Status   Specimen Description URINE, CLEAN CATCH  Final   Special Requests   Final    NONE Performed at [facility] Lab, [address], [place], [place] [address]    Culture >=100,000 COLONIES/mL ESCHERICHIA COLI (A)  Final   Report Status 09/20/2022 FINAL  Final   Organism ID, Bacteria ESCHERICHIA COLI (A)  Final      Susceptibility   Escherichia coli - MIC*    AMPICILLIN >=32 RESISTANT Resistant     CEFAZOLIN <=4 SENSITIVE Sensitive     CEFEPIME <=0.12 SENSITIVE Sensitive     CEFTRIAXONE <=0.25 SENSITIVE Sensitive     CIPROFLOXACIN <=0.25 SENSITIVE Sensitive     GENTAMICIN <=1 SENSITIVE Sensitive     IMIPENEM <=0.25 SENSITIVE Sensitive     NITROFURANTOIN <=16 SENSITIVE Sensitive     TRIMETH/SULFA <=20 SENSITIVE Sensitive     AMPICILLIN/SULBACTAM >=32 RESISTANT Resistant     PIP/TAZO <=4 SENSITIVE Sensitive     * >=100,000 COLONIES/mL ESCHERICHIA COLI  Blood Culture (routine x 2)     Status: None (Preliminary result)   Collection Time: 09/17/22 11:11 PM   Specimen: BLOOD RIGHT ARM  Result Value Ref Range Status   Specimen Description BLOOD RIGHT ARM  Final   Special Requests   Final    BOTTLES DRAWN AEROBIC AND ANAEROBIC Blood Culture adequate volume   Culture   Final  NO GROWTH 2 DAYS Performed at [facility], [address], [place], [place] [address]    Report Status PENDING  Incomplete  Blood Culture (routine x 2)     Status: None (Preliminary result)   Collection Time: 09/17/22 11:12 PM   Specimen: BLOOD LEFT ARM  Result Value Ref Range Status   Specimen Description BLOOD LEFT ARM  Final   Special Requests   Final    BOTTLES DRAWN AEROBIC AND ANAEROBIC Blood Culture adequate volume   Culture   Final    NO GROWTH 2 DAYS Performed at [facility], [address], [place], [place] [address]    Report Status PENDING  Incomplete     Labs: BNP (last 3 results) No results for input(s): "BNP" in the last 8760 hours. Basic Metabolic Panel: Recent Labs  Lab 09/17/22 2205 09/18/22 0815 09/19/22 0410  NA 130* 132* 136  K 3.3* 3.9 3.7  CL 87* 96* 99  CO2 26 23  27   GLUCOSE 104* 124* 101*  BUN 9 5* <5*  CREATININE 0.98 0.84 0.65  CALCIUM 9.2 8.3* 8.4*   Liver Function Tests: Recent Labs  Lab 09/17/22 2205 09/18/22 0815  AST 40 27  ALT 27 18  ALKPHOS 69 53  BILITOT 1.5* 1.0  PROT 8.2* 6.2*  ALBUMIN 4.2 3.1*   No results for input(s): "LIPASE", "AMYLASE" in the last 168 hours. No results for input(s): "AMMONIA" in the last 168 hours. CBC: Recent Labs  Lab 09/17/22 2205 09/18/22 0815 09/19/22 0410  WBC 13.2* 9.6 6.0  NEUTROABS 10.7*  --   --   HGB 12.6 10.3* 10.0*  HCT 38.2 31.2* 31.0*  MCV 94.1 94.5 96.0  PLT 408* 266 252   Cardiac Enzymes: No results for input(s): "CKTOTAL", "CKMB", "CKMBINDEX", "TROPONINI" in the last 168 hours. BNP: Invalid input(s): "POCBNP" CBG: No results for input(s): "GLUCAP" in the last 168 hours. D-Dimer No results for input(s): "DDIMER" in the last 72 hours. Hgb A1c No results for input(s): "HGBA1C" in the last 72 hours. Lipid Profile No results for input(s): "CHOL", "HDL", "LDLCALC", "TRIG", "CHOLHDL", "LDLDIRECT" in the last 72 hours. Thyroid function studies No results for input(s): "TSH", "T4TOTAL", "T3FREE", "THYROIDAB" in the last 72 hours.  Invalid input(s): "FREET3" Anemia work up No results for input(s): "VITAMINB12", "FOLATE", "FERRITIN", "TIBC", "IRON", "RETICCTPCT" in the last 72 hours. Urinalysis    Component Value Date/Time   COLORURINE AMBER (A) 09/17/2022 2205   APPEARANCEUR CLOUDY (A) 09/17/2022 2205   APPEARANCEUR Cloudy 01/12/2012 2133   LABSPEC 1.015 09/17/2022 2205   LABSPEC 1.009 01/12/2012 2133   PHURINE 6.0 09/17/2022 2205   GLUCOSEU NEGATIVE 09/17/2022 2205   GLUCOSEU Negative 01/12/2012 2133   HGBUR MODERATE (A) 09/17/2022 2205   BILIRUBINUR NEGATIVE 09/17/2022 2205   BILIRUBINUR Negative 01/12/2012 2133   KETONESUR 80 (A) 09/17/2022 2205   PROTEINUR 100 (A) 09/17/2022 2205   NITRITE POSITIVE (A) 09/17/2022 2205   LEUKOCYTESUR LARGE (A) 09/17/2022 2205    LEUKOCYTESUR Trace 01/12/2012 2133   Sepsis Labs Recent Labs  Lab 09/17/22 2205 09/18/22 0815 09/19/22 0410  WBC 13.2* 9.6 6.0   Microbiology Recent Results (from the past 240 hour(s))  Resp panel by RT-PCR (RSV, Flu A&B, Covid) Anterior Nasal Swab     Status: None   Collection Time: 09/17/22 10:03 PM   Specimen: Anterior Nasal Swab  Result Value Ref Range Status   SARS Coronavirus 2 by RT PCR NEGATIVE NEGATIVE Final   Influenza A  by PCR NEGATIVE NEGATIVE Final   Influenza B by PCR NEGATIVE NEGATIVE Final    Comment: (NOTE) The Xpert Xpress SARS-CoV-2/FLU/RSV plus assay is intended as an aid in the diagnosis of influenza from Nasopharyngeal swab specimens and should not be used as a sole basis for treatment. Nasal washings and aspirates are unacceptable for Xpert Xpress SARS-CoV-2/FLU/RSV testing.  Fact Sheet for Patients: BloggerCourse.com  Fact Sheet for Healthcare Providers: SeriousBroker.it  This test is not yet approved or cleared by the [place] FDA and has been authorized for detection and/or diagnosis of SARS-CoV-2 by FDA under an Emergency Use Authorization (EUA). This EUA will remain in effect (meaning this test can be used) for the duration of the COVID-19 declaration under Section 564(b)(1) of the Act, 21 U.S.C. section 360bbb-3(b)(1), unless the authorization is terminated or revoked.     Resp Syncytial Virus by PCR NEGATIVE NEGATIVE Final    Comment: (NOTE) Fact Sheet for Patients: BloggerCourse.com  Fact Sheet for Healthcare Providers: SeriousBroker.it  This test is not yet approved or cleared by the [place] FDA and has been authorized for detection and/or diagnosis of SARS-CoV-2 by FDA under an Emergency Use Authorization (EUA). This EUA will remain in effect (meaning this test can be used) for the duration of the COVID-19 declaration under  Section 564(b)(1) of the Act, 21 U.S.C. section 360bbb-3(b)(1), unless the authorization is terminated or revoked.  Performed at [facility], [address], [place], [place] [address]   Urine Culture     Status: Abnormal   Collection Time: 09/17/22 10:05 PM   Specimen: Urine, Clean Catch  Result Value Ref Range Status   Specimen Description URINE, CLEAN CATCH  Final   Special Requests   Final    NONE Performed at [facility], [address], [place], [place] [address]    Culture >=100,000 COLONIES/mL ESCHERICHIA COLI (A)  Final   Report Status 09/20/2022 FINAL  Final   Organism ID, Bacteria ESCHERICHIA COLI (A)  Final      Susceptibility   Escherichia coli - MIC*    AMPICILLIN >=32 RESISTANT Resistant     CEFAZOLIN <=4 SENSITIVE Sensitive     CEFEPIME <=0.12 SENSITIVE Sensitive     CEFTRIAXONE <=0.25 SENSITIVE Sensitive     CIPROFLOXACIN <=0.25 SENSITIVE Sensitive     GENTAMICIN <=1 SENSITIVE Sensitive     IMIPENEM <=0.25 SENSITIVE Sensitive     NITROFURANTOIN <=16 SENSITIVE Sensitive     TRIMETH/SULFA <=20 SENSITIVE Sensitive     AMPICILLIN/SULBACTAM >=32 RESISTANT Resistant     PIP/TAZO <=4 SENSITIVE Sensitive     * >=100,000 COLONIES/mL ESCHERICHIA COLI  Blood Culture (routine x 2)     Status: None (Preliminary result)   Collection Time: 09/17/22 11:11 PM   Specimen: BLOOD RIGHT ARM  Result Value Ref Range Status   Specimen Description BLOOD RIGHT ARM  Final   Special Requests   Final    BOTTLES DRAWN AEROBIC AND ANAEROBIC Blood Culture adequate volume   Culture   Final    NO GROWTH 2 DAYS Performed at [facility] Lab, [address], [place], [place] [address]    Report Status PENDING  Incomplete  Blood Culture (routine x 2)     Status: None (Preliminary result)   Collection Time: 09/17/22 11:12 PM   Specimen: BLOOD LEFT ARM  Result Value Ref Range Status   Specimen Description BLOOD LEFT ARM  Final   Special Requests   Final  BOTTLES DRAWN  AEROBIC AND ANAEROBIC Blood Culture adequate volume   Culture   Final    NO GROWTH 2 DAYS Performed at [facility] Lab, [address] [place], [place] [address]    Report Status PENDING  Incomplete     Time coordinating discharge:  32 minutes  SIGNED:   Dorcas Carrow, MD  [facility] 09/20/2022, 8:24 AM

## 2022-09-20 NOTE — TOC Transition Note (Signed)
Transition of Care [facility]) - CM/SW Discharge Note   Patient Details  Name: Kaitlin Lewis MRN: [id number] Date of Birth: Jan 27, 1990  Transition of [facility]) CM/SW Contact:  Janae Bridgeman, RN Phone Number: 09/20/2022, 8:45 AM   Clinical Narrative:    Patient with ETOH history.  Resources provided in the AVS for OP Counseling follow up.         Patient Goals and CMS Choice      Discharge Placement                         Discharge Plan and Services Additional resources added to the After Visit Summary for                                       Social Determinants of Health (SDOH) Interventions SDOH Screenings   Food Insecurity: Unknown (09/18/2022)  Housing: Low Risk  (09/18/2022)  Transportation Needs: No Transportation Needs (09/18/2022)  Tobacco Use: High Risk (09/17/2022)     Readmission Risk Interventions     No data to display

## 2022-09-22 LAB — CULTURE, BLOOD (ROUTINE X 2)
Culture: NO GROWTH
Culture: NO GROWTH
Special Requests: ADEQUATE
Special Requests: ADEQUATE

## 2022-10-15 ENCOUNTER — Encounter (HOSPITAL_BASED_OUTPATIENT_CLINIC_OR_DEPARTMENT_OTHER): Payer: Self-pay | Admitting: Emergency Medicine

## 2022-10-15 ENCOUNTER — Emergency Department (HOSPITAL_BASED_OUTPATIENT_CLINIC_OR_DEPARTMENT_OTHER): Payer: MEDICAID

## 2022-10-15 ENCOUNTER — Other Ambulatory Visit: Payer: Self-pay

## 2022-10-15 ENCOUNTER — Emergency Department (HOSPITAL_BASED_OUTPATIENT_CLINIC_OR_DEPARTMENT_OTHER)
Admission: EM | Admit: 2022-10-15 | Discharge: 2022-10-15 | Disposition: A | Discharge status: Home / Self Care | Payer: MEDICAID | Attending: Emergency Medicine | Admitting: Emergency Medicine

## 2022-10-15 DIAGNOSIS — M20012 Mallet finger of left finger(s): Secondary | ICD-10-CM | POA: Diagnosis present

## 2022-10-15 NOTE — Discharge Instructions (Signed)
Contact a health care provider if: You have pain or swelling that is getting worse. Your finger feels cold. You cannot extend your finger after treatment. You notice that the skin under the splint is red, raw, or has a blister. Get help right away if: Even after loosening your splint, your finger is: Very red and swollen. White or blue. Numb or tingling.

## 2022-10-15 NOTE — ED Provider Notes (Signed)
[facility] Provider Note   CSN: [id number] Arrival date & time: 10/15/22  1112     History  Chief Complaint  Patient presents with   Finger Injury    Kaitlin Lewis is a 32 y.o. female.` Who presents emergency department for left finger injury.  Patient was playing football yesterday when she caught a ball.  She felt a snap in her left ring finger.  She has had pain at the distal end and has been unable to extend the finger since that time.  She is right-hand dominant.  She denies numbness or tingling.  HPI     Home Medications Prior to Admission medications   Medication Sig Start Date End Date Taking? Authorizing Provider  gabapentin (NEURONTIN) 100 MG capsule Take 100 mg by mouth 3 (three) times daily as needed (sciatic nerve pain). 08/29/22   [provider]  rizatriptan (MAXALT) 5 MG tablet Take 5 mg by mouth daily as needed for migraine. 08/29/22   [provider]      Allergies    Patient has no known allergies.    Review of Systems   Review of Systems  Physical Exam Updated Vital Signs BP 106/71   Pulse 74   Temp 98.1 F (36.7 C)   Resp 18   Ht 5' (1.524 m)   Wt 56.2 kg   LMP 10/13/2022 (Approximate)   SpO2 100%   BMI 24.22 kg/m  Physical Exam Vitals and nursing note reviewed.  Constitutional:      General: She is not in acute distress.    Appearance: She is well-developed. She is not diaphoretic.  HENT:     Head: Normocephalic and atraumatic.     Right Ear: External ear normal.     Left Ear: External ear normal.     Nose: Nose normal.     Mouth/Throat:     Mouth: Mucous membranes are moist.  Eyes:     General: No scleral icterus.    Conjunctiva/sclera: Conjunctivae normal.  Cardiovascular:     Rate and Rhythm: Normal rate and regular rhythm.     Heart sounds: Normal heart sounds. No murmur heard.    No friction rub. No gallop.  Pulmonary:     Effort: Pulmonary effort is normal. No  respiratory distress.     Breath sounds: Normal breath sounds.  Abdominal:     General: Bowel sounds are normal. There is no distension.     Palpations: Abdomen is soft. There is no mass.     Tenderness: There is no abdominal tenderness. There is no guarding.  Musculoskeletal:     Cervical back: Normal range of motion.     Comments: Left ring finger with bruising over the dorsal surface.  The left DIP is hanging in flexion.  Patient is unable to extend the joint.  She has normal range of motion at all of the other joints.  Skin:    General: Skin is warm and dry.  Neurological:     Mental Status: She is alert and oriented to person, place, and time.  Psychiatric:        Behavior: Behavior normal.    ED Results / Procedures / Treatments   Labs (all labs ordered are listed, but only abnormal results are displayed) Labs Reviewed - No data to display  EKG None  Radiology DG Finger Ring Left  Result Date: 10/15/2022 CLINICAL DATA:  Injury, deformity. Fourth digit pain after playing football. EXAM:  LEFT RING FINGER 2+V COMPARISON:  None Available. FINDINGS: There is no evidence of fracture or dislocation. The alignment and joint spaces are normal per there is no evidence of arthropathy or other focal bone abnormality. Adjacent digits are intact. Mild soft tissue edema. IMPRESSION: Mild soft tissue edema. No fracture or dislocation. Electronically Signed   By: Narda Rutherford M.D.   On: 10/15/2022 13:18    Procedures Procedures    Medications Ordered in ED Medications - No data to display  ED Course/ Medical Decision Making/ A&P                                 Medical Decision Making Amount and/or Complexity of Data Reviewed Radiology: ordered.   Patient with Mallet finger deformity.  No evidence of fracture on imaging.  I visualized and interpreted these films.  Patient splinted in extension.  Follow-up with hand specialist.  Appropriate for discharge at this  time        Final Clinical Impression(s) / ED Diagnoses Final diagnoses:  Mallet deformity of left ring finger    Rx / DC Orders ED Discharge Orders     None         Arthor Captain, PA-C 10/15/22 1904    Charlynne Pander, MD 10/16/22 1453

## 2022-10-15 NOTE — ED Notes (Signed)
Called [facility] for patient pickup.

## 2022-10-15 NOTE — ED Triage Notes (Signed)
Pt to ER with c/o left hand fourth digit pain and deformity after playing football.  Pt states injury was yesterday. PMS intact.

## 2023-02-11 LAB — OB RESULTS CONSOLE RPR: RPR: NONREACTIVE

## 2023-02-28 DIAGNOSIS — F331 Major depressive disorder, recurrent, moderate: Secondary | ICD-10-CM | POA: Insufficient documentation

## 2023-03-21 DIAGNOSIS — F17210 Nicotine dependence, cigarettes, uncomplicated: Secondary | ICD-10-CM | POA: Insufficient documentation

## 2023-03-21 DIAGNOSIS — K219 Gastro-esophageal reflux disease without esophagitis: Secondary | ICD-10-CM | POA: Insufficient documentation

## 2023-03-21 DIAGNOSIS — M2550 Pain in unspecified joint: Secondary | ICD-10-CM | POA: Insufficient documentation

## 2023-03-21 DIAGNOSIS — D649 Anemia, unspecified: Secondary | ICD-10-CM | POA: Insufficient documentation

## 2023-03-21 DIAGNOSIS — G43909 Migraine, unspecified, not intractable, without status migrainosus: Secondary | ICD-10-CM | POA: Insufficient documentation

## 2023-08-05 ENCOUNTER — Ambulatory Visit: Payer: MEDICAID

## 2023-08-05 VITALS — BP 108/62 | Ht 62.0 in | Wt 143.0 lb

## 2023-08-05 DIAGNOSIS — Z3201 Encounter for pregnancy test, result positive: Secondary | ICD-10-CM

## 2023-08-05 DIAGNOSIS — Z309 Encounter for contraceptive management, unspecified: Secondary | ICD-10-CM

## 2023-08-05 LAB — PREGNANCY, URINE: Preg Test, Ur: POSITIVE — AB

## 2023-08-05 MED ORDER — PRENATAL 27-0.8 MG PO TABS
1.0000 | ORAL_TABLET | Freq: Every day | ORAL | Status: AC
Start: 2023-08-05 — End: 2023-11-13

## 2023-08-05 NOTE — Progress Notes (Cosign Needed Addendum)
 UPT positive. Plans prenatal care at [facility] and has appt 08/15/2023.  Positive preg packet given and reviewed.   Hx migraines and describes as worse since preg and not taking prescription med since preg.  Also taking omeprazole .   Consult Dr JAYSON Helling who recommends to stop omeprazole  and refer to Safe Meds in Pregnancy handout for alternative to help with heartburn.  For headache relief,  recommends hydration, such as water  and electrolyte drinks, regular meals, and refer to Safe Meds in Preg for pain relief options, such as Tylenol .   PHQ = 10 today. States hx depression. Recently moved from [facility] and admits to situational stresses. Denies thoughts of harming self/others. Wants to establish counselor in area. Local behavioral health resources given, such as [facility], [employer]. Accepts A Ellender, LCSW contact card today. Declines to speak to provider today.   The patient was dispensed prenatal vitamins #100 today per [facility] Dr JAYSON Helling. I provided counseling today regarding the medication. We discussed the medication, the side effects and when to call clinic. Patient given the opportunity to ask questions. Questions answered.    Sent to clerk for presumptive eligibiltiy for medicaid/preg women. Vannie Hochstetler, RN

## 2023-08-06 DIAGNOSIS — O0992 Supervision of high risk pregnancy, unspecified, second trimester: Secondary | ICD-10-CM | POA: Insufficient documentation

## 2023-08-13 ENCOUNTER — Other Ambulatory Visit: Payer: Self-pay

## 2023-08-13 ENCOUNTER — Encounter: Payer: Self-pay | Admitting: Emergency Medicine

## 2023-08-13 DIAGNOSIS — R519 Headache, unspecified: Secondary | ICD-10-CM | POA: Diagnosis present

## 2023-08-13 DIAGNOSIS — G43001 Migraine without aura, not intractable, with status migrainosus: Secondary | ICD-10-CM | POA: Diagnosis not present

## 2023-08-13 NOTE — ED Triage Notes (Signed)
 Patient ambulatory to triage with steady gait, without difficulty or distress noted; pt reports HA today accomp by nausea with hx same; took 2-500mg  tylenol  without relief

## 2023-08-14 ENCOUNTER — Emergency Department
Admission: EM | Admit: 2023-08-14 | Discharge: 2023-08-14 | Disposition: A | Discharge status: Home / Self Care | Payer: MEDICAID | Attending: Emergency Medicine | Admitting: Emergency Medicine

## 2023-08-14 ENCOUNTER — Emergency Department: Payer: MEDICAID

## 2023-08-14 DIAGNOSIS — G43001 Migraine without aura, not intractable, with status migrainosus: Secondary | ICD-10-CM

## 2023-08-14 LAB — HCG, QUANTITATIVE, PREGNANCY: hCG, Beta Chain, Quant, S: 108048 m[IU]/mL — ABNORMAL HIGH (ref <5)

## 2023-08-14 LAB — COMPREHENSIVE METABOLIC PANEL WITH GFR
ALT: 12 U/L (ref 0–44)
AST: 19 U/L (ref 15–41)
Albumin: 4.2 g/dL (ref 3.5–5.0)
Alkaline Phosphatase: 35 U/L — ABNORMAL LOW (ref 38–126)
Anion gap: 7 (ref 5–15)
BUN: 6 mg/dL (ref 6–20)
CO2: 23 mmol/L (ref 22–32)
Calcium: 9.2 mg/dL (ref 8.9–10.3)
Chloride: 102 mmol/L (ref 98–111)
Creatinine, Ser: 0.41 mg/dL — ABNORMAL LOW (ref 0.44–1.00)
GFR, Estimated: >60 mL/min (ref >60)
Glucose, Bld: 91 mg/dL (ref 70–99)
Potassium: 3.3 mmol/L — ABNORMAL LOW (ref 3.5–5.1)
Sodium: 132 mmol/L — ABNORMAL LOW (ref 135–145)
Total Bilirubin: 0.6 mg/dL (ref 0.0–1.2)
Total Protein: 7.8 g/dL (ref 6.5–8.1)

## 2023-08-14 LAB — CBC WITH DIFFERENTIAL/PLATELET
Abs Immature Granulocytes: 0.05 K/uL (ref 0.00–0.07)
Basophils Absolute: 0 K/uL (ref 0.0–0.1)
Basophils Relative: 0 %
Eosinophils Absolute: 0.1 K/uL (ref 0.0–0.5)
Eosinophils Relative: 1 %
HCT: 34 % — ABNORMAL LOW (ref 36.0–46.0)
Hemoglobin: 12.1 g/dL (ref 12.0–15.0)
Immature Granulocytes: 0 %
Lymphocytes Relative: 20 %
Lymphs Abs: 2.4 K/uL (ref 0.7–4.0)
MCH: 31.8 pg (ref 26.0–34.0)
MCHC: 35.6 g/dL (ref 30.0–36.0)
MCV: 89.2 fL (ref 80.0–100.0)
Monocytes Absolute: 0.6 K/uL (ref 0.1–1.0)
Monocytes Relative: 5 %
Neutro Abs: 8.8 K/uL — ABNORMAL HIGH (ref 1.7–7.7)
Neutrophils Relative %: 74 %
Platelets: 368 K/uL (ref 150–400)
RBC: 3.81 MIL/uL — ABNORMAL LOW (ref 3.87–5.11)
RDW: 11.8 % (ref 11.5–15.5)
WBC: 12.1 K/uL — ABNORMAL HIGH (ref 4.0–10.5)
nRBC: 0 % (ref 0.0–0.2)

## 2023-08-14 MED ORDER — IOHEXOL 350 MG/ML SOLN: 75.0000 mL | Freq: Once | INTRAVENOUS | Status: DC | PRN

## 2023-08-14 MED ORDER — IOHEXOL 350 MG/ML SOLN
75.0000 mL | Freq: Once | INTRAVENOUS | Status: AC | PRN
  Administered 2023-08-14: 75 mL via INTRAVENOUS

## 2023-08-14 MED ORDER — ACETAMINOPHEN 500 MG PO TABS
1000.0000 mg | ORAL_TABLET | Freq: Once | ORAL | Status: AC
  Administered 2023-08-14: 1000 mg via ORAL
  Filled 2023-08-14: qty 2

## 2023-08-14 MED ORDER — SODIUM CHLORIDE 0.9 % IV BOLUS (SEPSIS)
1000.0000 mL | Freq: Once | INTRAVENOUS | Status: AC
  Administered 2023-08-14: 1000 mL via INTRAVENOUS

## 2023-08-14 MED ORDER — METOCLOPRAMIDE HCL 5 MG/ML IJ SOLN
10.0000 mg | Freq: Once | INTRAMUSCULAR | Status: AC
  Administered 2023-08-14: 10 mg via INTRAVENOUS
  Filled 2023-08-14: qty 2

## 2023-08-14 NOTE — ED Provider Notes (Signed)
 [facility] Provider Note    Event Date/Time   First MD Initiated Contact with Patient 08/14/23 0149     (approximate)   History   Headache   HPI  Kaitlin Lewis is a 33 y.o. female G1, P0 with history of migraine headaches followed by neurology in [facility] who presents to the emergency department with complaints of severe headache.  States that initially this felt similar to her migraines but is now more severe and she has having more blurry vision than she normally does.  She is worried that something else could be going on.  No numbness, tingling, focal weakness.  No fevers.  No head injury.  No vomiting.  Patient reports she was recently on injections for her migraines but had to stop these secondary to being pregnant.  She is approximate 11 weeks, 4 days pregnant.  Her OB/GYN is with [facility].  She denies any abdominal pain, vaginal bleeding, dysuria, hematuria, discharge.  Neuro - [facility] in [facility] with [facility].  She states she is getting referred to go for neurology so that she can have a neurologist closer to home.  OB/GYN is with [facility].   History provided by patient.    Past Medical History:  Diagnosis Date   Anxiety    Depression    Migraine     Past Surgical History:  Procedure Laterality Date   NO PAST SURGERIES      MEDICATIONS:  Prior to Admission medications   Medication Sig Start Date End Date Taking? Authorizing Provider  gabapentin (NEURONTIN) 100 MG capsule Take 100 mg by mouth 3 (three) times daily as needed (sciatic nerve pain). Patient not taking: Reported on 08/05/2023 08/29/22   [provider]  Prenatal Vit-Fe Fumarate-FA (MULTIVITAMIN-PRENATAL) 27-0.8 MG TABS tablet Take 1 tablet by mouth daily at 12 noon. 08/05/23 11/13/23  Macario Dorothyann HERO, MD  rizatriptan (MAXALT) 5 MG tablet Take 5 mg by mouth daily as needed for migraine. Patient not taking: Reported on 08/05/2023 08/29/22   [provider]    Physical Exam   Triage Vital Signs: ED Triage Vitals  Encounter Vitals Group     BP 08/13/23 2341 110/70     Girls Systolic BP Percentile --      Girls Diastolic BP Percentile --      Boys Systolic BP Percentile --      Boys Diastolic BP Percentile --      Pulse Rate 08/13/23 2341 87     Resp 08/14/23 0106 18     Temp 08/13/23 2341 98.4 F (36.9 C)     Temp Source 08/13/23 2341 Oral     SpO2 08/13/23 2341 100 %     Weight 08/13/23 2343 145 lb (65.8 kg)     Height 08/13/23 2343 5' 2 (1.575 m)     Head Circumference --      Peak Flow --      Pain Score 08/13/23 2343 8     Pain Loc --      Pain Education --      Exclude from Growth Chart --     Most recent vital signs: Vitals:   08/13/23 2341 08/14/23 0106  BP: 110/70 101/80  Pulse: 87 92  Resp:  18  Temp: 98.4 F (36.9 C) 98.2 F (36.8 C)  SpO2: 100% 99%    CONSTITUTIONAL: Alert, responds appropriately to questions. Well-appearing; well-nourished HEAD: Normocephalic, atraumatic EYES: Conjunctivae clear, pupils appear equal, sclera  nonicteric ENT: normal nose; moist mucous membranes NECK: Supple, normal ROM, no meningismus CARD: RRR; S1 and S2 appreciated RESP: Normal chest excursion without splinting or tachypnea; breath sounds clear and equal bilaterally; no wheezes, no rhonchi, no rales, no hypoxia or respiratory distress, speaking full sentences ABD/GI: Non-distended; soft, non-tender, no rebound, no guarding, no peritoneal signs BACK: The back appears normal EXT: Normal ROM in all joints; no deformity noted, no edema SKIN: Normal color for age and race; warm; no rash on exposed skin NEURO: Moves all extremities equally, normal speech, normal gait, no facial asymmetry, normal sensation PSYCH: The patient's mood and manner are appropriate.   ED Results / Procedures / Treatments   LABS: (all labs ordered are listed, but only abnormal results are displayed) Labs Reviewed  CBC WITH  DIFFERENTIAL/PLATELET - Abnormal; Notable for the following components:      Result Value   WBC 12.1 (*)    RBC 3.81 (*)    HCT 34.0 (*)    Neutro Abs 8.8 (*)    All other components within normal limits  COMPREHENSIVE METABOLIC PANEL WITH GFR - Abnormal; Notable for the following components:   Sodium 132 (*)    Potassium 3.3 (*)    Creatinine, Ser 0.41 (*)    Alkaline Phosphatase 35 (*)    All other components within normal limits  HCG, QUANTITATIVE, PREGNANCY - Abnormal; Notable for the following components:   hCG, Beta Chain, Quant, S 108,048 (*)    All other components within normal limits     EKG:  EKG Interpretation Date/Time:    Ventricular Rate:    PR Interval:    QRS Duration:    QT Interval:    QTC Calculation:   R Axis:      Text Interpretation:           RADIOLOGY: My personal review and interpretation of imaging: CT venogram shows no acute abnormality.  I have personally reviewed all radiology reports.   CT VENOGRAM HEAD Result Date: 08/14/2023 EXAM: CT VENOGRAM WITH CONTAST 08/14/2023 02:47:25 AM TECHNIQUE: CT venogram of the head/brain was performed with the administration of intravenous contrast (75mL iohexol  (OMNIPAQUE ) 350 MG/ML injection). Multiplanar reformatted images are provided for review. MIP images are provided for review. Automated exposure control, iterative reconstruction, and/or weight based adjustment of the mA/kV was utilized to reduce the radiation dose to as low as reasonably achievable. COMPARISON: 05/02/2023 CLINICAL HISTORY: Dural venous sinus thrombosis suspected. Patient ambulatory to triage with steady gait, without difficulty or distress noted; pt reports HA today accomp by nausea with hx same; took 2-500mg  tylenol  without relief. FINDINGS: BRAIN/VENTRICLES: No acute intracranial hemorrhage. No extra axial fluid collection. Gray-white differentiation is maintained. No mass effect or midline shift. No hydrocephalus. ORBITS: No acute  abnormality. SINUSES AND MASTOIDS: No acute abnormality. SOFT TISSUES AND SKULL: No acute abnormality. CT VENOGRAM: No dural venous sinus thrombosis. No significant stenosis. IMPRESSION: 1. No acute intracranial abnormality. 2. No dural venous sinus thrombosis. Electronically signed by: Franky Stanford MD 08/14/2023 03:00 AM EDT RP Workstation: [id number]     PROCEDURES:  Critical Care performed: No    Procedures    IMPRESSION / MDM / ASSESSMENT AND PLAN / ED COURSE  I reviewed the triage vital signs and the nursing notes.    Patient here with complaints of severe headache, blurry vision.  History of migraines but feels like this is worse.  She is currently pregnant.  No focal neurodeficits.  No fever.  The patient is  on the cardiac monitor to evaluate for evidence of arrhythmia and/or significant heart rate changes.   DIFFERENTIAL DIAGNOSIS (includes but not limited to):   Migraine, cavernous sinus thrombosis, doubt intracranial hemorrhage, meningitis, encephalitis, stroke   Patient's presentation is most consistent with acute presentation with potential threat to life or bodily function.   PLAN: Labs obtained from triage show leukocytosis which is likely reactive or secondary to pregnancy.  She is not having any infectious symptoms.  Normal electrolytes, LFTs.  Blood pressure normal.  Patient states that this feels worse than her chronic migraine headaches.  I have offered imaging of her brain to rule out cavernous sinus thrombosis given she is pregnant and would be higher risk.  We did discuss the risk of radiation exposure but patient agrees to proceed.  Will give Tylenol , IV fluids, Reglan  for symptomatic relief.  We discussed that these medications are safe in pregnancy.   MEDICATIONS GIVEN IN ED: Medications  acetaminophen  (TYLENOL ) tablet 1,000 mg (1,000 mg Oral Given 08/14/23 0116)  metoCLOPramide  (REGLAN ) injection 10 mg (10 mg Intravenous Given 08/14/23 0227)  sodium  chloride 0.9 % bolus 1,000 mL (0 mLs Intravenous Stopped 08/14/23 0339)  iohexol  (OMNIPAQUE ) 350 MG/ML injection 75 mL (75 mLs Intravenous Contrast Given 08/14/23 0246)     ED COURSE: Patient reports feeling better after migraine cocktail.  CT venogram shows no acute abnormality when reviewed and interpreted by myself and the radiologist.  Patient is comfortable with plan for discharge home with close follow-up with her neurologist and OB/GYN as an outpatient.   At this time, I do not feel there is any life-threatening condition present. I reviewed all nursing notes, vitals, pertinent previous records.  All lab and urine results, EKGs, imaging ordered have been independently reviewed and interpreted by myself.  I reviewed all available radiology reports from any imaging ordered this visit.  Based on my assessment, I feel the patient is safe to be discharged home without further emergent workup and can continue workup as an outpatient as needed. Discussed all findings, treatment plan as well as usual and customary return precautions.  They verbalize understanding and are comfortable with this plan.  Outpatient follow-up has been provided as needed.  All questions have been answered.    CONSULTS:  none   OUTSIDE RECORDS REVIEWED: Reviewed recent neurology and OB/GYN notes.       FINAL CLINICAL IMPRESSION(S) / ED DIAGNOSES   Final diagnoses:  Migraine without aura and with status migrainosus, not intractable     Rx / DC Orders   ED Discharge Orders     None        Note:  This document was prepared using Dragon voice recognition software and may include unintentional dictation errors.   Kalandra Masters, Josette SAILOR, DO 08/14/23 0700

## 2023-08-14 NOTE — Discharge Instructions (Signed)
 You may take over-the-counter Tylenol  1000 mg every 6 hours as needed for pain.  Please follow-up with your OB/GYN, neurologist for further recommendations for managing your migraine headaches while pregnant.

## 2023-08-27 LAB — OB RESULTS CONSOLE HEPATITIS B SURFACE ANTIGEN: Hepatitis B Surface Ag: NEGATIVE

## 2023-08-31 LAB — OB RESULTS CONSOLE RUBELLA ANTIBODY, IGM: Rubella: IMMUNE

## 2023-08-31 LAB — OB RESULTS CONSOLE VARICELLA ZOSTER ANTIBODY, IGG: Varicella: IMMUNE

## 2023-09-03 LAB — PANORAMA PRENATAL TEST FULL PANEL:PANORAMA TEST PLUS 5 ADDITIONAL MICRODELETIONS: FETAL FRACTION: 9.2

## 2023-09-15 ENCOUNTER — Emergency Department: Payer: MEDICAID

## 2023-09-15 ENCOUNTER — Emergency Department
Admission: EM | Admit: 2023-09-15 | Discharge: 2023-09-15 | Disposition: A | Discharge status: Home / Self Care | Payer: MEDICAID | Attending: Emergency Medicine | Admitting: Emergency Medicine

## 2023-09-15 DIAGNOSIS — Z3A16 16 weeks gestation of pregnancy: Secondary | ICD-10-CM | POA: Insufficient documentation

## 2023-09-15 DIAGNOSIS — O26892 Other specified pregnancy related conditions, second trimester: Secondary | ICD-10-CM | POA: Diagnosis present

## 2023-09-15 DIAGNOSIS — R102 Pelvic and perineal pain: Secondary | ICD-10-CM | POA: Insufficient documentation

## 2023-09-15 LAB — CBC
HCT: 31.7 % — ABNORMAL LOW (ref 36.0–46.0)
Hemoglobin: 11.2 g/dL — ABNORMAL LOW (ref 12.0–15.0)
MCH: 32.6 pg (ref 26.0–34.0)
MCHC: 35.3 g/dL (ref 30.0–36.0)
MCV: 92.2 fL (ref 80.0–100.0)
Platelets: 347 K/uL (ref 150–400)
RBC: 3.44 MIL/uL — ABNORMAL LOW (ref 3.87–5.11)
RDW: 12.1 % (ref 11.5–15.5)
WBC: 9.8 K/uL (ref 4.0–10.5)
nRBC: 0 % (ref 0.0–0.2)

## 2023-09-15 LAB — WET PREP, GENITAL
Clue Cells Wet Prep HPF POC: NONE SEEN
Sperm: NONE SEEN
Trich, Wet Prep: NONE SEEN
WBC, Wet Prep HPF POC: <10 (ref <10)
Yeast Wet Prep HPF POC: NONE SEEN

## 2023-09-15 LAB — URINALYSIS, ROUTINE W REFLEX MICROSCOPIC
Bilirubin Urine: NEGATIVE
Glucose, UA: 50 mg/dL — AB
Hgb urine dipstick: NEGATIVE
Ketones, ur: NEGATIVE mg/dL
Leukocytes,Ua: NEGATIVE
Nitrite: NEGATIVE
Protein, ur: NEGATIVE mg/dL
Specific Gravity, Urine: 1.009 (ref 1.005–1.030)
pH: 8 (ref 5.0–8.0)

## 2023-09-15 LAB — COMPREHENSIVE METABOLIC PANEL WITH GFR
ALT: 11 U/L (ref 0–44)
AST: 18 U/L (ref 15–41)
Albumin: 3.9 g/dL (ref 3.5–5.0)
Alkaline Phosphatase: 43 U/L (ref 38–126)
Anion gap: 9 (ref 5–15)
BUN: 6 mg/dL (ref 6–20)
CO2: 24 mmol/L (ref 22–32)
Calcium: 9.4 mg/dL (ref 8.9–10.3)
Chloride: 102 mmol/L (ref 98–111)
Creatinine, Ser: 0.53 mg/dL (ref 0.44–1.00)
GFR, Estimated: >60 mL/min (ref >60)
Glucose, Bld: 87 mg/dL (ref 70–99)
Potassium: 3.8 mmol/L (ref 3.5–5.1)
Sodium: 135 mmol/L (ref 135–145)
Total Bilirubin: 0.5 mg/dL (ref 0.0–1.2)
Total Protein: 7.3 g/dL (ref 6.5–8.1)

## 2023-09-15 LAB — LIPASE, BLOOD: Lipase: 29 U/L (ref 11–51)

## 2023-09-15 NOTE — ED Notes (Signed)
FHR 160

## 2023-09-15 NOTE — ED Triage Notes (Signed)
 Pt presents to the ED via POV from home for lower abdominal pressure and bilateral flank pain x1 month. Pt was treated for BV, but states that the discomfort has gotten worse. Pt reports that her OB said she may have kidney stones. Pt is [redacted]w[redacted]d with her first baby. Pt denies N/V/D.

## 2023-09-15 NOTE — ED Provider Notes (Signed)
 [facility] Provider Note    Event Date/Time   First MD Initiated Contact with Patient 09/15/23 [phone]     (approximate)  History   Chief Complaint: Abdominal Pain  HPI  Kaitlin Lewis is a 33 y.o. female with a past medical history anxiety, migraines, presents to the emergency department for lower abdominal discomfort.  Patient is approximately [redacted] weeks pregnant, states that for the last week or so she has been experiencing lower abdominal discomfort.  She states several weeks ago she was diagnosed with BV at her OBs office.  Completed a course of antibiotics but continues to experience pressure in the lower abdomen.  Patient was at her OBs office recently and they tested her urine found to have blood as well as calcium oxalate they are concerned that the patient could be experiencing a kidney stone and sent her to the emergency department here.  Patient denies any back pain or fever.  Patient is experiencing some lower abdominal discomfort/pressure.  This is the patient's first pregnancy.  States she is having urinary frequency or she states it feels like she needs to pee every couple hours.  Physical Exam   Triage Vital Signs: ED Triage Vitals [09/15/23 1634]  Encounter Vitals Group     BP 115/79     Girls Systolic BP Percentile      Girls Diastolic BP Percentile      Boys Systolic BP Percentile      Boys Diastolic BP Percentile      Pulse Rate (!) 107     Resp 18     Temp 98.1 F (36.7 C)     Temp Source Oral     SpO2 98 %     Weight 141 lb (64 kg)     Height 5' 1 (1.549 m)     Head Circumference      Peak Flow      Pain Score 8     Pain Loc      Pain Education      Exclude from Growth Chart     Most recent vital signs: Vitals:   09/15/23 1634  BP: 115/79  Pulse: (!) 107  Resp: 18  Temp: 98.1 F (36.7 C)  SpO2: 98%    General: Awake, no distress.  CV:  Good peripheral perfusion.  Regular rate and rhythm  Resp:  Normal effort.  Equal  breath sounds bilaterally.  Abd:  No distention.  Soft, nontender.  No rebound or guarding.  Benign abdomen Other:  Bedside ultrasound shows a fetal heart rate of 146 bpm with great fetal movement    ED Results / Procedures / Treatments   RADIOLOGY  Renal ultrasound is normal   MEDICATIONS ORDERED IN ED: Medications - No data to display   IMPRESSION / MDM / ASSESSMENT AND PLAN / ED COURSE  I reviewed the triage vital signs and the nursing notes.  Patient's presentation is most consistent with acute presentation with potential threat to life or bodily function.  Patient presents to the emergency department for lower abdominal discomfort/pressure.  Patient's workup so far reassuring with a normal CBC, normal chemistry, normal LFTs and lipase.  Urinalysis shows no concerning findings.  Will perform a wet prep to ensure no BV infection currently.  Given the report of blood and calcium oxalate in her urine at her OBs office we will obtain a renal ultrasound as a precaution.  Her urinalysis in the emergency department appears normal.  Discussed with  the patient that her symptoms could just be due to her second trimester pregnancy and enlargement of the uterus now pressing on the bladder.  Patient's workup is reassuring normal CBC chemistry.  Urinalysis shows no concerning findings wet prep is negative.  Patient's lipase is normal.  Ultrasound is normal.  Given the patient's reassuring workup I believe the patient safe for discharge home with outpatient follow-up.  Patient agreeable to plan of care.  She will continue to take her nitrofurantoin as prescribed by her OB.  FINAL CLINICAL IMPRESSION(S) / ED DIAGNOSES   Abdominal pain during pregnancy   Note:  This document was prepared using Dragon voice recognition software and may include unintentional dictation errors.   Dorothyann Drivers, MD 09/15/23 2047

## 2023-11-14 ENCOUNTER — Telehealth: Payer: Self-pay | Admitting: Licensed Clinical Social Worker

## 2023-11-14 NOTE — Telephone Encounter (Signed)
-----   Message from Andriette DELENA Lambing sent at 11/14/2023 10:28 AM EDT ----- Regarding: Mental Health Referral Good morning,  Kaitlin Lewis is [redacted] weeks pregnant with her first child. She was diagnosed with depression over 10 years ago. Her medication recently switched from Busbar to Wellbutrin. 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 She is one year sober. FOB also suffers from addiction of some sort and stays at a local facility. She is stressing over housing due to living with her father and his girlfriend, who may be separating. I have provided her with housing options yesterday. She is requesting mental health services. Could you please reach out to her?   Thanks for all that you do! Jenna Brackman

## 2023-12-02 ENCOUNTER — Observation Stay
Admission: EM | Admit: 2023-12-02 | Discharge: 2023-12-02 | Disposition: A | Discharge status: Home / Self Care | Payer: MEDICAID | Attending: Obstetrics | Admitting: Obstetrics

## 2023-12-02 ENCOUNTER — Other Ambulatory Visit: Payer: Self-pay

## 2023-12-02 ENCOUNTER — Observation Stay: Payer: MEDICAID

## 2023-12-02 DIAGNOSIS — O99013 Anemia complicating pregnancy, third trimester: Secondary | ICD-10-CM | POA: Diagnosis not present

## 2023-12-02 DIAGNOSIS — O368130 Decreased fetal movements, third trimester, not applicable or unspecified: Principal | ICD-10-CM | POA: Insufficient documentation

## 2023-12-02 DIAGNOSIS — F17210 Nicotine dependence, cigarettes, uncomplicated: Secondary | ICD-10-CM | POA: Insufficient documentation

## 2023-12-02 DIAGNOSIS — Z3A27 27 weeks gestation of pregnancy: Secondary | ICD-10-CM | POA: Insufficient documentation

## 2023-12-02 DIAGNOSIS — O99891 Other specified diseases and conditions complicating pregnancy: Secondary | ICD-10-CM | POA: Insufficient documentation

## 2023-12-02 DIAGNOSIS — O99353 Diseases of the nervous system complicating pregnancy, third trimester: Secondary | ICD-10-CM | POA: Diagnosis not present

## 2023-12-02 DIAGNOSIS — O99340 Other mental disorders complicating pregnancy, unspecified trimester: Secondary | ICD-10-CM | POA: Insufficient documentation

## 2023-12-02 DIAGNOSIS — O368190 Decreased fetal movements, unspecified trimester, not applicable or unspecified: Principal | ICD-10-CM | POA: Diagnosis present

## 2023-12-02 NOTE — OB Triage Note (Signed)
 Patient discharged to home. Discharge instructions reviewed. Patient verbalizes understanding.

## 2023-12-02 NOTE — OB Triage Note (Signed)
 Patient is a 33 yo, G1P0, at 27 weeks 2 days. Patient presents with complaints of decreased fetal movement.  Patient denies any vaginal bleeding or LOF.  Monitors applied and assessing. VSS BP 110/79   Pulse 110   Resp 18   Temp 98.74f. Initial fetal heart tone 150. CNM notified of patients arrival to unit. Plan to keep baby on the monitor for at least one hour.

## 2023-12-02 NOTE — Discharge Summary (Signed)
 Kaitlin Lewis is a 33 y.o. female. She is at [redacted]w[redacted]d gestation. Patient's last menstrual period was 05/25/2023. 02/29/2024, by Last Menstrual Period   Prenatal care site: [facility]  Chief complaint: decreased fetal movement  Admission Diagnoses:  1) intrauterine pregnancy at [redacted]w[redacted]d  2) Decreased fetal movement [O36.8190]  Discharge Diagnoses:  Principal Problem:   Decreased fetal movement    HPI: Kaitlin Lewis presents to L&D with complaints of decreased fetal movement.  Her pregnancy is complicated by: Patient Active Problem List   Diagnosis Date Noted   Decreased fetal movement 12/02/2023   Encounter for supervision of high risk pregnancy in second trimester, antepartum 08/06/2023   Acid reflux disease 03/21/2023   Anemia 03/21/2023   Cigarette smoker 03/21/2023   Migraine without status migrainosus, not intractable 03/21/2023   Polyarthralgia 03/21/2023   Moderate episode of recurrent major depressive disorder (HCC) 02/28/2023   Sepsis (HCC) 09/18/2022   Pyelonephritis 09/18/2022   Sepsis secondary to UTI (HCC) 09/18/2022    She denies Contractions, Loss of fluid, or Vaginal bleeding. Endorses fetal movement as decreased .   S: Resting comfortably. no CTX, no VB.no LOF,  Active fetal movement.   Maternal Medical History:  Past Medical Hx:  has a past medical history of Anxiety, Depression, and Migraine.    Past Surgical Hx:  has a past surgical history that includes No past surgeries.   No Known Allergies   Prior to Admission medications   Medication Sig Start Date End Date Taking? Authorizing Provider  gabapentin (NEURONTIN) 100 MG capsule Take 100 mg by mouth 3 (three) times daily as needed (sciatic nerve pain). Patient not taking: Reported on 08/05/2023 08/29/22   [provider]  rizatriptan (MAXALT) 5 MG tablet Take 5 mg by mouth daily as needed for migraine. Patient not taking: Reported on 08/05/2023 08/29/22   [provider]    Social  History: She  reports that she has been smoking cigarettes. She has never used smokeless tobacco. She reports that she does not currently use alcohol. She reports that she does not currently use drugs after having used the following drugs: Marijuana.  Family History: family history is not on file.   Review of Systems: A full review of systems was performed and negative except as noted in the HPI.     Pertinent Results:   O:  BP 107/61   Pulse 88   Temp 98.2 F (36.8 C) (Oral)   Resp 16   LMP 05/25/2023   SpO2 100%  No results found for this or any previous visit (from the past 48 hours).  US  OB Limited Result Date: 12/02/2023 CLINICAL DATA:  Decreased fetal movement. EXAM: LIMITED OBSTETRIC ULTRASOUND COMPARISON:  None Available. FINDINGS: Number of Fetuses: 1 Heart Rate:  144 bpm Movement: Detected Presentation: Breech Placental Location: Anterior Previa: No Amniotic Fluid (Subjective):  Within normal limits. BPD: 7.2 cm 28 w  6 d MATERNAL FINDINGS: Cervix:  Appears closed.  The cervical length is 3 cm. Uterus/Adnexae: No abnormality visualized. IMPRESSION: Single live intrauterine pregnancy. This exam is performed on an emergent basis and does not comprehensively evaluate fetal size, dating, or anatomy; follow-up complete OB US  should be considered if further fetal assessment is warranted. Electronically Signed   By: Vanetta Chou M.D.   On: 12/02/2023 12:06    Constitutional: NAD, AAOx3  PULM: nl respiratory effort Abd: gravid, non-tender Ext: Non-tender, Nonedmeatous Psych: mood appropriate, speech normal Pelvic : deferred SVE:     NST: Baseline  FHR: 140 beats/min Variability: moderate Accelerations: present Decelerations: present, intermittent small variables appropriate for gestational age Tocometry: quiet Time: about 4 hours  Interpretation: Category II INDICATIONS: decreased fetal movement RESULTS:  A NST procedure was performed with FHR monitoring and a normal  baseline established, appropriate time of 20-40 minutes of evaluation, and accels >2 seen w 15x15 characteristics.  Results show a REACTIVE NST.   Consults: none  Procedures: limited ob ultrasound LIMITED OBSTETRIC ULTRASOUND   COMPARISON:  None Available.   FINDINGS: Number of Fetuses: 1   Heart Rate:  144 bpm   Movement: Detected   Presentation: Breech   Placental Location: Anterior   Previa: No   Amniotic Fluid (Subjective):  Within normal limits.   BPD: 7.2 cm 28 w  6 d   MATERNAL FINDINGS:   Cervix:  Appears closed.  The cervical length is 3 cm.   Uterus/Adnexae: No abnormality visualized.  Hospital Course: The patient was admitted to Labor and Delivery Triage for observation. For decreased fetal movement. She had a reassuring fetal heart tracing and was monitored about 4 hours. Limited ob ultrasound completed with positive fetal movement and appropriate fluid  She was deemed stable for discharge and further outpatient management.   Discharge Condition: good  Disposition: Discharge disposition: 01-Home or Self Care       Allergies as of 12/02/2023   No Known Allergies      Medication List     STOP taking these medications    gabapentin 100 MG capsule Commonly known as: NEURONTIN   rizatriptan 5 MG tablet Commonly known as: MAXALT       Dr Beverli updated and aware ----- Raciel Caffrey, CNM  Certified Nurse Midwife [facility]

## 2023-12-12 LAB — OB RESULTS CONSOLE HIV ANTIBODY (ROUTINE TESTING): HIV: NONREACTIVE

## 2023-12-12 NOTE — Patient Instructions (Signed)
 [email or web address]

## 2023-12-26 ENCOUNTER — Other Ambulatory Visit: Payer: Self-pay

## 2023-12-26 ENCOUNTER — Observation Stay
Admission: EM | Admit: 2023-12-26 | Discharge: 2023-12-26 | Disposition: A | Discharge status: Home / Self Care | Payer: MEDICAID | Attending: Registered Nurse | Admitting: Registered Nurse

## 2023-12-26 ENCOUNTER — Encounter: Payer: Self-pay | Admitting: Obstetrics and Gynecology

## 2023-12-26 DIAGNOSIS — R252 Cramp and spasm: Secondary | ICD-10-CM | POA: Insufficient documentation

## 2023-12-26 DIAGNOSIS — R109 Unspecified abdominal pain: Secondary | ICD-10-CM

## 2023-12-26 DIAGNOSIS — O99353 Diseases of the nervous system complicating pregnancy, third trimester: Secondary | ICD-10-CM | POA: Diagnosis not present

## 2023-12-26 DIAGNOSIS — O479 False labor, unspecified: Secondary | ICD-10-CM | POA: Diagnosis present

## 2023-12-26 DIAGNOSIS — Z3A30 30 weeks gestation of pregnancy: Secondary | ICD-10-CM | POA: Diagnosis not present

## 2023-12-26 DIAGNOSIS — G5601 Carpal tunnel syndrome, right upper limb: Secondary | ICD-10-CM

## 2023-12-26 DIAGNOSIS — O26899 Other specified pregnancy related conditions, unspecified trimester: Principal | ICD-10-CM | POA: Diagnosis present

## 2023-12-26 DIAGNOSIS — O26893 Other specified pregnancy related conditions, third trimester: Principal | ICD-10-CM | POA: Insufficient documentation

## 2023-12-26 NOTE — OB Triage Note (Signed)
 Patient presents to triage with complaints of painful abdominal cramping. Patient rates pain 5 out of 10. Patient states recent sexual activity. Patient states that pain started today at work after increased physical activities. Patient denies bleeding or leaking of fluid. Monitors applied.  30 minutes of monitoring performed FHR Baseline: 135 Moderate variablity  15x15 accels No decels BP 112/67 HR 113 Spo2 98 No contractions noted on monitor

## 2023-12-26 NOTE — H&P (Signed)
 LABOR & DELIVERY OB TRIAGE NOTE  SUBJECTIVE  HPI Kaitlin Lewis is a 33 y.o. G1P0000 at [redacted]w[redacted]d who presents to Labor & Delivery for evaluation of uterine cramping. She was at work today and started noticing menstrual-like cramping. When she arrived, she rated the cramping at 5/10. She now says that the cramping has dissipated and isn't feeling them anymore (0/10) since she has been drinking water  and resting. She denies LOF, vaginal bleeding. Has had intercourse within 24 hrs. +fetal movement. She also has questions about carpal tunnel sx in her right hand/ wrist as well as some superficial burning sensations at her fundus.   OB History     Gravida  1   Para  0   Term  0   Preterm  0   AB  0   Living  0      SAB  0   IAB  0   Ectopic  0   Multiple  0   Live Births  0          OBJECTIVE  LMP 05/25/2023   General: Alert, oriented, good historian, NAD Heart: Well perfused Lungs: Normal work of breathing Abdomen: Gravid, NT Cervical exam:   Deferred  NST I reviewed the NST and it was reactive.  Baseline: 130's Variability: moderate Accelerations: Present Decelerations: None Toco: +Irritability  ASSESSMENT Impression 1) Pregnancy at G1P0000, [redacted]w[redacted]d, Estimated Date of Delivery: 02/29/24 2) Reassuring maternal/fetal status. 3) Uterine irritability likely 2/2 long work hours and need for hydration 4) Right carpal tunnel sx 5) Superficial nerve irritation from growing/ gravid uterus  PLAN Reviewed coming in for eval for >5 cramps/ ctx in an hour that persist after rest and hydration with minimum of 16 oz non-caffeinated fluids. Also should present with LOF or vaginal bleeding. Discussed trial of wrist splint for carpal tunnel. May need future referral for cortisone injection. Discussed comfort measures for hot spot at fundus. Provided reassurance. RTC for routine prenatal care. Given work excuse/ note prior to discharge.  Lauraine Lakes, CNM 12/26/23  5:57  PM

## 2023-12-26 NOTE — OB Triage Note (Signed)
Patient discharged to home.  Verbalizes understanding of discharge instructions. 

## 2024-01-23 NOTE — L&D Delivery Note (Signed)
 Delivery Note  Kaitlin Lewis is a G1P1001 at [redacted]w[redacted]d with an LMP of 05/25/23, consistent with US  at [redacted]w[redacted]d.    Second Stage: Complete dilation at 2030 Onset of pushing at 2044 FHR second stage  decels with pushing   [name] presented to L&D for IOL for suspected macrosomia  She progressed  to C/C/+2 with a spontaneous urge to push.  She pushed  effectively over approximately 54 minutes for a spontaneous vaginal birth. Delivery of a viable baby boy on 02/22/2024 . by CNM. Delivery of fetal head in position: Occiput,, Anterior position with restitution to position: Left,, Occiput,, Transverse no nuchal cord;  Anterior then posterior shoulders delivered with some difficulty with gentle downward traction. Baby placed on mom's chest, and attended to by baby RN and NNP Cord double clamped after cessation of pulsation, cut by FOB    Third Stage: Oxytocin  bolus started after delivery of placenta for hemorrhage prophylaxis, Placenta delivered schultz intact with 3 VC @ 2145 Placenta disposition:.discarded per protocol To Pathology: No  Uterine tone firm / exam; vaginal bleeding: minimal  Laceration: 1st degree, 2nd degree, vaginal, and labial laceration identified  Anesthesia for repair: procedures; anesthesia: epidural Repair suture type: 2.0 3.0 vicryl Est. Blood Loss (mL): 350  Complications:difficulty delivery anterior shoulder d/t maternal effort, patient stopped pushing after delivery of fetal head  Mom to postpartum.  Baby to Couplet care / Skin to Skin.  Newborn: Information for the patient's newborn:  Rylan, Kaufmann [[id number]]  Live born female  Birth Weight: 9 lb 3.8 oz (4190 g) APGAR: 5, 8  Newborn Delivery   Birth date/time: 02/22/2024 21:38:00 Delivery type: Vaginal, Spontaneous      Feeding planned: formula feeding  ---------- Bobbette Brunswick, CNM Certified Nurse Midwife [facility]

## 2024-01-28 NOTE — Progress Notes (Signed)
 " Chief Complaint  Patient presents with   Establish Care   Chest Pain    Subjective  Kaitlin Lewis is a 34 y.o. female who presents for Establish Care and Chest Pain HPI History of Present Illness Kaitlin Lewis is a 34 year old female who presents with elevated heart rate during pregnancy. She was referred by her OBGYN for evaluation of elevated heart rate.  She is currently pregnant with her first child, with an expected due date of February 7th. She has been experiencing an elevated heart rate during recent visits, prompting further evaluation. She also had a recent respiratory infection with associated chest pain and coughing, which she suspects may be contributing to her symptoms.  She has a history of sciatica, which predates her pregnancy and has caused significant discomfort and difficulty walking. She was previously hospitalized due to severe sciatica symptoms.  No dizziness or lightheadedness to the point of falling, although she occasionally sees spots, a symptom she has experienced for a long time. She does not drink the recommended ten cups of water  daily but consumes a lot of fluids, including juice.  She is unsure about her parents' medical history, but she is aware that her father does not have heart disease.  Review of Systems  Patient Active Problem List  Diagnosis   Encounter for supervision of high risk pregnancy in second trimester, antepartum (HHS-HCC)   Moderate episode of recurrent major depressive disorder (CMS-HCC)   Migraine without status migrainosus, not intractable   Polyarthralgia   Anemia   Pyelonephritis   Acid reflux disease   Cigarette smoker   Cramping affecting pregnancy, antepartum (HHS-HCC)   Uterine contractions during pregnancy (HHS-HCC)   Decreased fetal movement (HHS-HCC)   Difficulty breathing   Encounter for annual physical exam   History of broken nose   Sepsis secondary to UTI  (CMS/HHS-HCC)    Outpatient Medications  Prior to Visit  Medication Sig Dispense Refill   acetaminophen  (TYLENOL ) 500 MG tablet Take by mouth     aspirin 81 MG chewable tablet Take 1 tablet (81 mg total) by mouth once daily 30 tablet 11   buPROPion (WELLBUTRIN XL) 300 MG XL tablet Take 1 tablet (300 mg total) by mouth once daily 90 tablet 3   magnesium oxide (MAG-OX) 400 mg (241.3 mg magnesium) tablet Take 1 tablet (400 mg total) by mouth once daily 90 tablet 3   prenatal vit-iron fum-folic ac (PRENAVITE) tablet Take 1 tablet by mouth once daily     riboflavin, vitamin B2, (RIBOFLAVIN ORAL) Take 800 mg by mouth     butalbital-acetaminophen -caffeine (FIORICET) 50-325-40 mg tablet Take 1 tablet by mouth every 6 (six) hours as needed for Headache (Patient not taking: Reported on 01/28/2024)     ferrous sulfate (IRON ORAL) Take 325 mg by mouth (Patient not taking: Reported on 01/28/2024)     folic acid  (FOLVITE ) 400 MCG tablet Take 1 tablet (400 mcg total) by mouth once daily (Patient not taking: Reported on 01/28/2024) 30 tablet 11   No facility-administered medications prior to visit.      Objective  Vitals:   01/28/24 1021  BP: 112/80  Pulse: (!) 115  Resp: 16  SpO2: 98%  Weight: 75.3 kg (166 lb)  Height: 157.5 cm (5' 2)  PainSc: 0-No pain   Body mass index is 30.36 kg/m.  Home Vitals:     Physical Exam Physical Exam CHEST: Lungs clear to auscultation bilaterally.  Constitutional: alert, in NAD, and communicates well Eye exam: pupils  equal and reactive, extraocular eye movements intact. Neck: supple, no thyroid enlargement or cervical adenopathy, and no bruits heard Respiratory: clear to auscultation, without rales or wheezes  Cardiovascular: regular rate and rhythm and without murmurs, rubs or gallops Lower extremities: no lower extremity edema Skin ankles/feet: warm, good capillary refill and no ulcerations or lesions noted Neurological: sensorimotor grossly intact and normal muscle  tone  Results Diagnostic EKG: Sinus tachycardia (Independently interpreted)     Assessment/Plan:   Assessment & Plan Tachycardia Heart rate slightly elevated, likely related to recent respiratory infection and pregnancy. No significant family history of heart disease. EKG shows slight tachycardia but otherwise normal. No dizziness or lightheadedness reported. - Ordered echocardiogram to assess cardiac function - Scheduled follow-up appointment in a couple of weeks to review echocardiogram results - Advised to monitor heart rate at home using a timer and pulse check - Instructed to seek medical attention if heart rate consistently exceeds 130 bpm  Chest pain Likely related to recent respiratory infection. - Continue to monitor symptoms and seek medical attention if symptoms worsen Diagnoses and all orders for this visit:  Chest pain at rest -     ECG 12-lead -     Echo complete; Future  Tachycardia -     Echo complete; Future  [redacted] weeks gestation of pregnancy (HHS-HCC)    Return in about 4 weeks (around 02/25/2024).     Future Appointments     Date/Time Provider Department Center Visit Type   02/06/2024 10:00 AM (Arrive by 9:45 AM) [facility] C[facility] US  1 [facility] ECHO COMPLETE   02/07/2024 9:45 AM [facility] OBGYN US  2 [facility] C US  [facility] GENERAL   02/07/2024 10:30 AM Tanda Edsel Charlies Maryl Nicoletta MARYL C ROUTINE PRENATAL VISIT   02/24/2024 10:15 AM (Arrive by 10:00 AM) Dewane Shiner, DO [facility] FOLLOW UP   03/19/2024 7:45 AM [facility] C LAB   03/24/2024 9:15 AM Lane Arthea Locus, MD [facility] C RETURN VISIT   03/26/2024 8:30 AM Minor, Jonette Penning, PA [facility] C PHYSICAL       There are no Patient Instructions on file for this visit.  An after visit summary was provided for the patient either in written format (printed) or through [facility].  This note has been created using automated tools and reviewed for accuracy by [facility].  "

## 2024-01-29 ENCOUNTER — Other Ambulatory Visit: Payer: Self-pay

## 2024-01-29 ENCOUNTER — Encounter: Payer: Self-pay | Admitting: Emergency Medicine

## 2024-01-29 ENCOUNTER — Emergency Department
Admission: EM | Admit: 2024-01-29 | Discharge: 2024-01-29 | Disposition: A | Discharge status: Home / Self Care | Payer: MEDICAID | Attending: Emergency Medicine | Admitting: Emergency Medicine

## 2024-01-29 DIAGNOSIS — O99613 Diseases of the digestive system complicating pregnancy, third trimester: Secondary | ICD-10-CM | POA: Diagnosis not present

## 2024-01-29 DIAGNOSIS — Z3A35 35 weeks gestation of pregnancy: Secondary | ICD-10-CM | POA: Diagnosis not present

## 2024-01-29 DIAGNOSIS — K2100 Gastro-esophageal reflux disease with esophagitis, without bleeding: Secondary | ICD-10-CM | POA: Diagnosis not present

## 2024-01-29 DIAGNOSIS — R079 Chest pain, unspecified: Secondary | ICD-10-CM

## 2024-01-29 DIAGNOSIS — O26893 Other specified pregnancy related conditions, third trimester: Secondary | ICD-10-CM | POA: Diagnosis present

## 2024-01-29 LAB — CBC WITH DIFFERENTIAL/PLATELET
Abs Immature Granulocytes: 0.04 K/uL (ref 0.00–0.07)
Basophils Absolute: 0 K/uL (ref 0.0–0.1)
Basophils Relative: 0 %
Eosinophils Absolute: 0.1 K/uL (ref 0.0–0.5)
Eosinophils Relative: 1 %
HCT: 31.9 % — ABNORMAL LOW (ref 36.0–46.0)
Hemoglobin: 11 g/dL — ABNORMAL LOW (ref 12.0–15.0)
Immature Granulocytes: 1 %
Lymphocytes Relative: 21 %
Lymphs Abs: 1.7 K/uL (ref 0.7–4.0)
MCH: 32.2 pg (ref 26.0–34.0)
MCHC: 34.5 g/dL (ref 30.0–36.0)
MCV: 93.3 fL (ref 80.0–100.0)
Monocytes Absolute: 0.7 K/uL (ref 0.1–1.0)
Monocytes Relative: 8 %
Neutro Abs: 5.6 K/uL (ref 1.7–7.7)
Neutrophils Relative %: 69 %
Platelets: 298 K/uL (ref 150–400)
RBC: 3.42 MIL/uL — ABNORMAL LOW (ref 3.87–5.11)
RDW: 12.3 % (ref 11.5–15.5)
WBC: 8.1 K/uL (ref 4.0–10.5)
nRBC: 0 % (ref 0.0–0.2)

## 2024-01-29 LAB — BASIC METABOLIC PANEL WITH GFR
Anion gap: 12 (ref 5–15)
BUN: 5 mg/dL — ABNORMAL LOW (ref 6–20)
CO2: 22 mmol/L (ref 22–32)
Calcium: 9.3 mg/dL (ref 8.9–10.3)
Chloride: 102 mmol/L (ref 98–111)
Creatinine, Ser: 0.55 mg/dL (ref 0.44–1.00)
GFR, Estimated: >60 mL/min
Glucose, Bld: 83 mg/dL (ref 70–99)
Potassium: 3.8 mmol/L (ref 3.5–5.1)
Sodium: 136 mmol/L (ref 135–145)

## 2024-01-29 LAB — TROPONIN T, HIGH SENSITIVITY: Troponin T High Sensitivity: <15 ng/L (ref 0–19)

## 2024-01-29 MED ORDER — LIDOCAINE VISCOUS HCL 2 % MT SOLN
15.0000 mL | Freq: Once | OROMUCOSAL | Status: AC
  Administered 2024-01-29: 15 mL via ORAL
  Filled 2024-01-29: qty 15

## 2024-01-29 MED ORDER — ALUM & MAG HYDROXIDE-SIMETH 200-200-20 MG/5ML PO SUSP
30.0000 mL | Freq: Once | ORAL | Status: AC
  Administered 2024-01-29: 30 mL via ORAL
  Filled 2024-01-29: qty 30

## 2024-01-29 NOTE — ED Triage Notes (Addendum)
 Pt arrived via POV with c/o mid chest pain radiating to left side x 1 week and is getting worse, reports shortness of breath also.  Pt states pain is worse with laying on side at times.   Pt c/o nausea x 1 week also.   Pt is 35W 4D pregnant, first pregnancy.  Pt states she has been having acid reflux  OBGYN: [facility]  Pt reports abd cramping and braxton-hicks, reports + fetal movement.

## 2024-01-29 NOTE — ED Provider Notes (Signed)
 "  [facility] Provider Note    Event Date/Time   First MD Initiated Contact with Patient 01/29/24 [phone]     (approximate)   History   Chief Complaint Chest Pain   HPI  Kaitlin Lewis is a 34 y.o. female G1P0 at approximately 35 weeks of pregnancy who presents to the ED complaining of chest pain.  Patient reports that she has had about 4 days of sharp discomfort in her chest that seems to be worse when she lays on her side or her back.  She reports having a cough earlier in the week that she thought could be a viral infection, however cough is improved.  She does report feeling slightly short of breath at times, but denies any current difficulty breathing.  She has been feeling nauseous at times but denies significant abdominal pain and has not had any vaginal bleeding, continues to feel baby move normally.  She describes discomfort when laying flat as like something wants to come up.  She is currently following with cardiology for elevated heart rate during pregnancy.     Physical Exam   Triage Vital Signs: ED Triage Vitals  Encounter Vitals Group     BP 01/29/24 0412 110/78     Girls Systolic BP Percentile --      Girls Diastolic BP Percentile --      Boys Systolic BP Percentile --      Boys Diastolic BP Percentile --      Pulse Rate 01/29/24 0412 95     Resp 01/29/24 0412 18     Temp 01/29/24 0412 98.2 F (36.8 C)     Temp Source 01/29/24 0412 Oral     SpO2 01/29/24 0412 100 %     Weight 01/29/24 0411 166 lb (75.3 kg)     Height 01/29/24 0411 5' 1 (1.549 m)     Head Circumference --      Peak Flow --      Pain Score 01/29/24 0410 7     Pain Loc --      Pain Education --      Exclude from Growth Chart --     Most recent vital signs: Vitals:   01/29/24 0412  BP: 110/78  Pulse: 95  Resp: 18  Temp: 98.2 F (36.8 C)  SpO2: 100%    Constitutional: Alert and oriented. Eyes: Conjunctivae are normal. Head: Atraumatic. Nose: No  congestion/rhinnorhea. Mouth/Throat: Mucous membranes are moist.  Cardiovascular: Normal rate, regular rhythm. Grossly normal heart sounds.  2+ radial pulses bilaterally. Respiratory: Normal respiratory effort.  No retractions. Lungs CTAB. Gastrointestinal: Soft and nontender. No distention. Musculoskeletal: No lower extremity tenderness nor edema.  Neurologic:  Normal speech and language. No gross focal neurologic deficits are appreciated.    ED Results / Procedures / Treatments   Labs (all labs ordered are listed, but only abnormal results are displayed) Labs Reviewed  CBC WITH DIFFERENTIAL/PLATELET - Abnormal; Notable for the following components:      Result Value   RBC 3.42 (*)    Hemoglobin 11.0 (*)    HCT 31.9 (*)    All other components within normal limits  BASIC METABOLIC PANEL WITH GFR - Abnormal; Notable for the following components:   BUN 5 (*)    All other components within normal limits  TROPONIN T, HIGH SENSITIVITY     EKG  ED ECG REPORT I, Carlin Palin, the attending physician, personally viewed and interpreted this ECG.   Date:  01/29/2024  EKG Time: 4:12  Rate: 106  Rhythm: sinus tachycardia  Axis: Normal  Intervals:none  ST&T Change: None  PROCEDURES:  Critical Care performed: No  Procedures   MEDICATIONS ORDERED IN ED: Medications  alum & mag hydroxide-simeth (MAALOX/MYLANTA) 200-200-20 MG/5ML suspension 30 mL (30 mLs Oral Given 01/29/24 0524)    And  lidocaine  (XYLOCAINE ) 2 % viscous mouth solution 15 mL (15 mLs Oral Given 01/29/24 0524)     IMPRESSION / MDM / ASSESSMENT AND PLAN / ED COURSE  I reviewed the triage vital signs and the nursing notes.                              34 y.o. female G1P0 with past medical history of migraines and GERD who presents to the ED complaining of 4 days of chest discomfort worse with certain positions and associated with some occasional difficulty breathing.  Patient's presentation is most consistent  with acute presentation with potential threat to life or bodily function.  Differential diagnosis includes, but is not limited to, ACS, PE, pneumonia, pneumothorax, musculoskeletal pain, GERD, anxiety, viral illness.  Patient well-appearing and in no acute distress, vital signs are unremarkable.  EKG shows no evidence of arrhythmia or ischemia and symptoms seem atypical for ACS.  Lab results are pending at this time, in further discussion with the patient we will hold off on chest x-ray given low suspicion for pneumonia or pneumothorax.  Viral illness considered but her cough does seem to be improving.  The way she describes pain is worse when she lies flat and feeling like something needs to come up, suspect symptoms may be related to GERD.  We will trial GI cocktail while awaiting results.  Labs are reassuring with no significant anemia, leukocytosis, electrolyte abnormality, or AKI.  Troponin within normal limits and I doubt ACS, also doubt PE as symptoms resolved with GI cocktail.  Patient appropriate for discharge home with outpatient follow-up, was counseled to return to the ED for new or worsening symptoms.  Patient agrees with plan.      FINAL CLINICAL IMPRESSION(S) / ED DIAGNOSES   Final diagnoses:  Nonspecific chest pain  Gastroesophageal reflux disease with esophagitis without hemorrhage     Rx / DC Orders   ED Discharge Orders     None        Note:  This document was prepared using Dragon voice recognition software and may include unintentional dictation errors.   Willo Dunnings, MD 01/29/24 [phone]  "

## 2024-01-29 NOTE — Discharge Instructions (Signed)
 Your discomfort in your chest seems likely to be due to gastritis and GERD, which is common late in pregnancy.  You can use over-the-counter Maalox, Pepcid, and Tums as needed for the symptoms and should limit intake of any acidic foods or drinks.  Please follow-up with your OB/GYN and return to the ER for any new or worsening symptoms.

## 2024-02-07 LAB — OB RESULTS CONSOLE GC/CHLAMYDIA
Chlamydia: NEGATIVE
Neisseria Gonorrhea: NEGATIVE

## 2024-02-07 LAB — OB RESULTS CONSOLE GBS: GBS: NEGATIVE

## 2024-02-07 NOTE — Progress Notes (Signed)
 Patient's last menstrual period was 05/25/2023 (exact date). Estimated Date of Delivery: 02/29/24  34 y.o. G1P0 at [redacted]w[redacted]d  The primary encounter diagnosis was Supervision of high risk pregnancy in third trimester (HHS-HCC). Diagnoses of Macrosomia (HHS-HCC), Anxiety and depression, History of migraine during pregnancy, and Encounter for supervision of high risk pregnancy in second trimester, antepartum (HHS-HCC) were also pertinent to this visit.  S:   Patient concerns today:  - questions about baby's size and vaginal vs cesarean birth  Chief Complaint  Patient presents with   Routine Prenatal Visit    Reports: good fetal movement  Denies bleeding, contractions, cramping or leaking.   O:   See [facility] flowsheets. BP 112/79   Pulse 102   Wt 75.8 kg (167 lb 3.2 oz)   LMP 05/25/2023 (Exact Date)   BMI 30.58 kg/m  Gen: NAD  Pulm: No use of accessory muscles, normal respirations Abdomen: Gravid, nontender Pelvic: SVE deferred  Ext : mild edema, no rashes Psych: Mood, insight, judgement intact  OB US : EFW 3425g (86%) AFI 14.5cm Cephalic presentation FHR 156bpm Placenta anterior  A/P:  34 y.o. G1P0 at [redacted]w[redacted]d   - Problem list reviewed and/or updated.  1. Supervision of high risk pregnancy in third trimester (HHS-HCC) -HSV prophylaxis not indicated -GBS CT/GC collected.   -Planning FOB and her dad's girlfriend for labor support;  -Labor plans reviewed.  Desires epidural -Feeding preferences reviewed. Desires breast, formula -Peds picked: [facility] counts reviewed with the patient in detail.  Patient instructed to assess for fetal activity daily at regular intervals.  Counts to be done if decreased activity noted.  Patient instructed to contact the office if counts do not reveal adequate movements. Labor precautions (ROM, vaginal bleeding, s/s labor) reviewed.  2. Macrosomia (HHS-HCC) EFW today 3425g (86%) Plan 39wk IOL, after discussing benefits and risks of  vaginal vs cesarean birth, she would like to attempt a vaginal birth.  3. Anxiety and depression Taking Wellbutrin 300mg  daily  4. History of migraine during pregnancy Taking Vitamin B2, magnesium , and Sumatriptan    FKC reviewed.  Pre-term labor precautions reviewed.   Return in about 1 week (around 02/14/2024) for routine Prenatal.   I personally performed the service, non-incident to. (WP)   DANIELLE CHARLIES BLUSH , CNM 02/07/2024 11:01 AM

## 2024-02-13 ENCOUNTER — Other Ambulatory Visit: Payer: Self-pay

## 2024-02-13 ENCOUNTER — Inpatient Hospital Stay
Admission: EM | Admit: 2024-02-13 | Discharge: 2024-02-13 | Disposition: A | Discharge status: Home / Self Care | Payer: MEDICAID | Attending: Obstetrics and Gynecology | Admitting: Obstetrics and Gynecology

## 2024-02-13 ENCOUNTER — Encounter: Payer: Self-pay | Admitting: Obstetrics and Gynecology

## 2024-02-13 DIAGNOSIS — Z3A37 37 weeks gestation of pregnancy: Secondary | ICD-10-CM | POA: Diagnosis not present

## 2024-02-13 DIAGNOSIS — O99353 Diseases of the nervous system complicating pregnancy, third trimester: Secondary | ICD-10-CM | POA: Diagnosis not present

## 2024-02-13 DIAGNOSIS — G43909 Migraine, unspecified, not intractable, without status migrainosus: Secondary | ICD-10-CM | POA: Diagnosis present

## 2024-02-13 DIAGNOSIS — R519 Headache, unspecified: Secondary | ICD-10-CM

## 2024-02-13 LAB — CBC
HCT: 30 % — ABNORMAL LOW (ref 36.0–46.0)
Hemoglobin: 10.9 g/dL — ABNORMAL LOW (ref 12.0–15.0)
MCH: 32.7 pg (ref 26.0–34.0)
MCHC: 36.3 g/dL — ABNORMAL HIGH (ref 30.0–36.0)
MCV: 90.1 fL (ref 80.0–100.0)
Platelets: 336 K/uL (ref 150–400)
RBC: 3.33 MIL/uL — ABNORMAL LOW (ref 3.87–5.11)
RDW: 12.5 % (ref 11.5–15.5)
WBC: 10.1 K/uL (ref 4.0–10.5)
nRBC: 0 % (ref 0.0–0.2)

## 2024-02-13 LAB — COMPREHENSIVE METABOLIC PANEL WITH GFR
ALT: 19 U/L (ref 0–44)
AST: 39 U/L (ref 15–41)
Albumin: 3.6 g/dL (ref 3.5–5.0)
Alkaline Phosphatase: 147 U/L — ABNORMAL HIGH (ref 38–126)
Anion gap: 13 (ref 5–15)
BUN: 5 mg/dL — ABNORMAL LOW (ref 6–20)
CO2: 21 mmol/L — ABNORMAL LOW (ref 22–32)
Calcium: 9.6 mg/dL (ref 8.9–10.3)
Chloride: 104 mmol/L (ref 98–111)
Creatinine, Ser: 0.53 mg/dL (ref 0.44–1.00)
GFR, Estimated: >60 mL/min
Glucose, Bld: 75 mg/dL (ref 70–99)
Potassium: 3.9 mmol/L (ref 3.5–5.1)
Sodium: 137 mmol/L (ref 135–145)
Total Bilirubin: 0.3 mg/dL (ref 0.0–1.2)
Total Protein: 6.3 g/dL — ABNORMAL LOW (ref 6.5–8.1)

## 2024-02-13 LAB — PROTEIN / CREATININE RATIO, URINE
Creatinine, Urine: 30 mg/dL
Total Protein, Urine: <6 mg/dL

## 2024-02-13 MED ORDER — MAGNESIUM SULFATE IN D5W 1-5 GM/100ML-% IV SOLN
1.0000 g | Freq: Once | INTRAVENOUS | Status: AC
  Administered 2024-02-13: 1 g via INTRAVENOUS
  Filled 2024-02-13: qty 100

## 2024-02-13 MED ORDER — METOCLOPRAMIDE HCL 5 MG/ML IJ SOLN
10.0000 mg | Freq: Once | INTRAMUSCULAR | Status: AC
  Administered 2024-02-13: 10 mg via INTRAVENOUS
  Filled 2024-02-13: qty 2

## 2024-02-13 MED ORDER — DIPHENHYDRAMINE HCL 50 MG/ML IJ SOLN
25.0000 mg | Freq: Once | INTRAMUSCULAR | Status: AC
  Administered 2024-02-13: 25 mg via INTRAVENOUS
  Filled 2024-02-13: qty 1

## 2024-02-13 MED ORDER — LACTATED RINGERS IV BOLUS
1000.0000 mL | Freq: Once | INTRAVENOUS | Status: AC
  Administered 2024-02-13: 1000 mL via INTRAVENOUS

## 2024-02-13 NOTE — Progress Notes (Addendum)
 Reassessed migraine pain, 5/10 now. Pt says pain is now bearable and she has gotten relief. Pt reports no other pain. Pt reports not feeling any painful regular contractions. Will finish magnesium  infusion, then discharge. Axavier Pressley L. Keano Guggenheim, RN BSN 02/13/2024 10:32 PM

## 2024-02-13 NOTE — OB Triage Note (Signed)
 Kaitlin Lewis 34 y.o. @[redacted]w[redacted]d  G1P0 presents to Labor & Delivery triage via wheelchair steered by ED staff reporting migraine that started 2 days ago that has progressively intensified. She states she has had migraines before pregnancy and in pregnancy but no recently. She also states pins and needles below her knees in both legs. This started a few days ago as well and typically occurs with standing. Pt reports silver spots in her vision and sensitivity to light. Pt reports nausea and diarrhea that started yesterday. She denies signs and symptoms consistent with rupture of membranes or active vaginal bleeding. She denies contractions and states positive fetal movement. External FM and TOCO applied to non-tender abdomen. Initial FHR 150. Vital signs obtained. BP 133/80. Patient oriented to care environment including call bell and bed control use. Verdon , MD notified of patient's arrival. Verdon at bedside @ 2001. Chinmay Squier L. Branch Pacitti, RN BSN 02/13/2024 8:01 PM

## 2024-02-13 NOTE — OB Triage Provider Note (Signed)
 TRIAGE VISIT with NST   [name] is a 34 y.o. G1P0000. She is at [redacted]w[redacted]d gestation.  Indication: Migraine  S: Resting comfortably. B-H CTX, no VB. Active fetal movement. Denies SOB, new onset swelling, RUQ pain, visual changes.  +silver spots in her vision, throbbing headache unrelieved with home meds. O:  BP 133/80 (BP Location: Right Arm)   Pulse 91   Temp 98.2 F (36.8 C) (Oral)   Resp 17   Ht 5' 1 (1.549 m)   Wt 76.2 kg   LMP 05/25/2023   BMI 31.74 kg/m  No results found for this or any previous visit (from the past 48 hours).   Gen: NAD, AAOx3      Abd: FNTTP      Ext: Non-tender, 1+ edena bilaterally Reflexes:   NST: Baseline: 150 Variability: Moderate Accelerations: 15x15 Decelerations: None  TOCO: quiet SVE: Dilation: 1 Effacement (%): 60 Station: -3 Presentation: Vertex Exam by:: Arnez Stoneking  NST: Category I strip, see detailed evaluation above  A/P:  34 y.o. G1P0000 [redacted]w[redacted]d with headache.                       Labor: not present.  R/o PreEclampsia:  Labs sent Fetal Wellbeing: Reassuring Cat 1 tracing. Migraine: IVF, + reglan  +benadryl  +1g iv mag monitor

## 2024-02-13 NOTE — OB Triage Note (Signed)
 Patient discharged home in stable condition by provider. Pt reports decreased pain. Discharge instructions given. Patient verbalized understanding, no questions or concerns. Pt discharged in personal vehicle with support person. Labor precautions given.   Kaitlin Lewis L. Zunairah Devers, RN BSN 02/13/2024 11:12 PM

## 2024-02-13 NOTE — Discharge Summary (Signed)
 Migraine sx improved with IVF and medication. Cat I strip Not in labor Pt discharged home

## 2024-02-18 ENCOUNTER — Other Ambulatory Visit: Payer: Self-pay | Admitting: Obstetrics and Gynecology

## 2024-02-18 DIAGNOSIS — Z3490 Encounter for supervision of normal pregnancy, unspecified, unspecified trimester: Secondary | ICD-10-CM

## 2024-02-18 NOTE — Progress Notes (Signed)
 G1P0000 at [redacted]w[redacted]d, LMP of 05/25/23, c/w early US  at [redacted]w[redacted]d.  Scheduled for fetal macrosomia induction of labor on 02/22/24.   Prenatal provider: [facility] Pregnancy complicated by: Patient Active Problem List   Diagnosis Date Noted   Cramping affecting pregnancy, antepartum 12/26/2023   Uterine contractions during pregnancy 12/26/2023   Decreased fetal movement 12/02/2023   Encounter for supervision of high risk pregnancy in second trimester, antepartum 08/06/2023   Acid reflux disease 03/21/2023   Anemia 03/21/2023   Cigarette smoker 03/21/2023   Migraine without status migrainosus, not intractable 03/21/2023   Polyarthralgia 03/21/2023   Moderate episode of recurrent major depressive disorder (HCC) 02/28/2023   Sepsis (HCC) 09/18/2022   Pyelonephritis 09/18/2022   Sepsis secondary to UTI (HCC) 09/18/2022    Prenatal Labs: Blood type/Rh A Positive  Antibody screen neg  Rubella immune    Varicella Immune  RPR NR  HBsAg   NR  Hep C NR  HIV   NR  GC neg  Chlamydia neg  Genetic screening cfDNA negative  1 hour GTT 96  3 hour GTT   GBS   Neg   - Tdap vaccine: Given prenatally - Flu vaccine: Given prenatally -RSV vaccine:RSV Given: Given during pregnancy >/=14 days ago  Contraception:Contraceptives: IUD Mirena Feeding preference: formula feeding  ____ Bobbette Brunswick, CNM Certified Nurse Midwife [place] [facility]

## 2024-02-22 ENCOUNTER — Other Ambulatory Visit: Payer: Self-pay

## 2024-02-22 ENCOUNTER — Encounter: Payer: Self-pay | Admitting: Obstetrics and Gynecology

## 2024-02-22 ENCOUNTER — Inpatient Hospital Stay
Admission: EM | Admit: 2024-02-22 | Discharge: 2024-02-24 | DRG: 806 | Disposition: A | Payer: MEDICAID | Attending: Obstetrics | Admitting: Obstetrics

## 2024-02-22 ENCOUNTER — Inpatient Hospital Stay: Payer: MEDICAID

## 2024-02-22 DIAGNOSIS — D62 Acute posthemorrhagic anemia: Secondary | ICD-10-CM | POA: Diagnosis not present

## 2024-02-22 DIAGNOSIS — O99334 Smoking (tobacco) complicating childbirth: Secondary | ICD-10-CM | POA: Diagnosis present

## 2024-02-22 DIAGNOSIS — O9962 Diseases of the digestive system complicating childbirth: Secondary | ICD-10-CM | POA: Diagnosis present

## 2024-02-22 DIAGNOSIS — O9081 Anemia of the puerperium: Secondary | ICD-10-CM | POA: Diagnosis not present

## 2024-02-22 DIAGNOSIS — Z3490 Encounter for supervision of normal pregnancy, unspecified, unspecified trimester: Principal | ICD-10-CM | POA: Diagnosis present

## 2024-02-22 DIAGNOSIS — F17210 Nicotine dependence, cigarettes, uncomplicated: Secondary | ICD-10-CM | POA: Diagnosis present

## 2024-02-22 DIAGNOSIS — Z79899 Other long term (current) drug therapy: Secondary | ICD-10-CM | POA: Diagnosis not present

## 2024-02-22 DIAGNOSIS — O3663X0 Maternal care for excessive fetal growth, third trimester, not applicable or unspecified: Principal | ICD-10-CM | POA: Diagnosis present

## 2024-02-22 DIAGNOSIS — Z3A39 39 weeks gestation of pregnancy: Secondary | ICD-10-CM

## 2024-02-22 DIAGNOSIS — K219 Gastro-esophageal reflux disease without esophagitis: Secondary | ICD-10-CM | POA: Diagnosis present

## 2024-02-22 LAB — URINE DRUG SCREEN
Amphetamines: NEGATIVE
Barbiturates: NEGATIVE
Benzodiazepines: NEGATIVE
Cocaine: NEGATIVE
Fentanyl: NEGATIVE
Methadone Scn, Ur: NEGATIVE
Opiates: NEGATIVE
Tetrahydrocannabinol: POSITIVE — AB

## 2024-02-22 LAB — CBC
HCT: 31.9 % — ABNORMAL LOW (ref 36.0–46.0)
Hemoglobin: 11.3 g/dL — ABNORMAL LOW (ref 12.0–15.0)
MCH: 31.8 pg (ref 26.0–34.0)
MCHC: 35.4 g/dL (ref 30.0–36.0)
MCV: 89.9 fL (ref 80.0–100.0)
Platelets: 345 10*3/uL (ref 150–400)
RBC: 3.55 MIL/uL — ABNORMAL LOW (ref 3.87–5.11)
RDW: 12.6 % (ref 11.5–15.5)
WBC: 9.5 10*3/uL (ref 4.0–10.5)
nRBC: 0 % (ref 0.0–0.2)

## 2024-02-22 LAB — SYPHILIS: RPR W/REFLEX TO RPR TITER AND TREPONEMAL ANTIBODIES, TRADITIONAL SCREENING AND DIAGNOSIS ALGORITHM: RPR Ser Ql: NONREACTIVE

## 2024-02-22 LAB — RUPTURE OF MEMBRANE (ROM)PLUS: Rom Plus: POSITIVE

## 2024-02-22 MED ORDER — OXYTOCIN BOLUS FROM INFUSION
333.0000 mL | Freq: Once | INTRAVENOUS | Status: AC
  Administered 2024-02-22: 333 mL via INTRAVENOUS

## 2024-02-22 MED ORDER — LACTATED RINGERS IV SOLN
500.0000 mL | INTRAVENOUS | Status: DC | PRN
  Administered 2024-02-22 (×2): 500 mL via INTRAVENOUS

## 2024-02-22 MED ORDER — LIDOCAINE HCL (PF) 1 % IJ SOLN: 30.0000 mL | INTRAMUSCULAR | Status: DC | PRN

## 2024-02-22 MED ORDER — SODIUM CHLORIDE 0.9 % IV SOLN
INTRAVENOUS | Status: DC | PRN
  Administered 2024-02-22: 4 mL via EPIDURAL

## 2024-02-22 MED ORDER — ACETAMINOPHEN 325 MG PO TABS
650.0000 mg | ORAL_TABLET | ORAL | Status: DC | PRN
  Administered 2024-02-23: 650 mg via ORAL
  Filled 2024-02-22: qty 2

## 2024-02-22 MED ORDER — MISOPROSTOL 25 MCG QUARTER TABLET
25.0000 ug | ORAL_TABLET | ORAL | Status: DC
  Administered 2024-02-22 (×2): 25 ug via VAGINAL
  Filled 2024-02-22 (×2): qty 1

## 2024-02-22 MED ORDER — OXYTOCIN-SODIUM CHLORIDE 30-0.9 UT/500ML-% IV SOLN
2.5000 [IU]/h | INTRAVENOUS | Status: DC
  Administered 2024-02-22: 2.5 [IU]/h via INTRAVENOUS

## 2024-02-22 MED ORDER — MISOPROSTOL 200 MCG PO TABS
ORAL_TABLET | ORAL | Status: AC
  Filled 2024-02-22: qty 4

## 2024-02-22 MED ORDER — TERBUTALINE SULFATE 1 MG/ML IJ SOLN: 0.2500 mg | Freq: Once | INTRAMUSCULAR | Status: DC | PRN

## 2024-02-22 MED ORDER — FENTANYL CITRATE (PF) 100 MCG/2ML IJ SOLN
50.0000 ug | INTRAMUSCULAR | Status: DC | PRN
  Administered 2024-02-22 (×3): 100 ug via INTRAVENOUS
  Filled 2024-02-22 (×3): qty 2

## 2024-02-22 MED ORDER — OXYTOCIN-SODIUM CHLORIDE 30-0.9 UT/500ML-% IV SOLN
1.0000 m[IU]/min | INTRAVENOUS | Status: DC
  Administered 2024-02-22: 2 m[IU]/min via INTRAVENOUS
  Filled 2024-02-22: qty 500

## 2024-02-22 MED ORDER — TRANEXAMIC ACID-NACL 1000-0.7 MG/100ML-% IV SOLN
INTRAVENOUS | Status: AC
  Filled 2024-02-22: qty 100

## 2024-02-22 MED ORDER — OXYTOCIN 10 UNIT/ML IJ SOLN
INTRAMUSCULAR | Status: AC
  Filled 2024-02-22: qty 2

## 2024-02-22 MED ORDER — AMMONIA AROMATIC IN INHA
RESPIRATORY_TRACT | Status: AC
  Filled 2024-02-22: qty 10

## 2024-02-22 MED ORDER — SOD CITRATE-CITRIC ACID 500-334 MG/5ML PO SOLN
30.0000 mL | ORAL | Status: DC | PRN
  Administered 2024-02-22: 30 mL via ORAL
  Filled 2024-02-22: qty 30

## 2024-02-22 MED ORDER — LIDOCAINE HCL (PF) 1 % IJ SOLN
INTRAMUSCULAR | Status: AC
  Filled 2024-02-22: qty 30

## 2024-02-22 MED ORDER — MISOPROSTOL 25 MCG QUARTER TABLET
25.0000 ug | ORAL_TABLET | ORAL | Status: DC
  Administered 2024-02-22 (×2): 25 ug via ORAL
  Filled 2024-02-22 (×2): qty 1

## 2024-02-22 MED ORDER — ONDANSETRON HCL 4 MG/2ML IJ SOLN
4.0000 mg | Freq: Four times a day (QID) | INTRAMUSCULAR | Status: DC | PRN
  Administered 2024-02-22 (×2): 4 mg via INTRAVENOUS
  Filled 2024-02-22 (×2): qty 2

## 2024-02-22 MED ORDER — FENTANYL-BUPIVACAINE-NACL 0.5-0.125-0.9 MG/250ML-% EP SOLN
EPIDURAL | Status: DC | PRN
  Administered 2024-02-22: 12 mL/h via EPIDURAL

## 2024-02-22 MED ORDER — SODIUM CHLORIDE 0.9 % IV SOLN: INTRAVENOUS | Status: DC | PRN

## 2024-02-22 MED ORDER — LACTATED RINGERS IV SOLN: INTRAVENOUS | Status: DC

## 2024-02-22 MED ORDER — FENTANYL-BUPIVACAINE-NACL 0.5-0.125-0.9 MG/250ML-% EP SOLN
EPIDURAL | Status: AC
  Filled 2024-02-22: qty 250

## 2024-02-22 NOTE — Anesthesia Procedure Notes (Addendum)
 Epidural  Start time: 02/22/2024 3:15 PM  Staffing Anesthesiologist: Melia Deacon, MD Performed: anesthesiologist   Preanesthetic Checklist Completed: patient identified, IV checked, risks and benefits discussed, monitors and equipment checked and pre-op evaluation  Epidural Patient position: sitting Prep: ChloraPrep Approach: midline Location: L4-L5 Injection technique: LOR saline  Needle:  Needle type: Tuohy  Needle gauge: 17 G Needle length: 9 cm Needle insertion depth: 5 cm Catheter at skin depth: 10 cm Test dose: negative and 1.5% lidocaine  with Epi 1:200 K  Assessment Events: blood not aspirated, no cerebrospinal fluid, injection not painful, no injection resistance, no paresthesia and negative IV test  Additional Notes Reason for block:procedure for pain

## 2024-02-22 NOTE — H&P (Signed)
 OB History & Physical   History of Present Illness:  Chief Complaint:   HPI:  Kaitlin Lewis is a 34 y.o. G1P0000 female at [redacted]w[redacted]d dated by LMP.  She presents to L&D for IOL for macrosomia   She reports:  -active fetal movement -no leakage of fluid -no vaginal bleeding -no contractions  Pregnancy Issues: Smokers: Vaping/Cigarettes:  Fetal Macrosomia H/o mental health diagnoses: Recurrent Major Depressive Disorder Manic Depression, H/o Migraines Migrainosus, not intractable H/o Substance Abuse: Alcohol Abuse  H/o UTI's Pyelonephritis, Sepsis secondary to UTI Housing insecurity    Maternal Medical History:   Past Medical History:  Diagnosis Date   Anxiety    Depression    Migraine     Past Surgical History:  Procedure Laterality Date   NO PAST SURGERIES      Allergies[1]  Prior to Admission medications  Medication Sig Start Date End Date Taking? Authorizing Provider  buPROPion (WELLBUTRIN XL) 300 MG 24 hr tablet Take 300 mg by mouth daily.    [provider]  ferrous sulfate  325 (65 FE) MG EC tablet Take 325 mg by mouth 3 (three) times daily with meals.    [provider]  Prenatal Vit-Fe Fumarate-FA (PRENATAL MULTIVITAMIN) TABS tablet Take 1 tablet by mouth daily at 12 noon.    [provider]     Prenatal care site: [facility]   Social History: She  reports that she has been smoking cigarettes. She has never used smokeless tobacco. She reports that she does not currently use alcohol. She reports that she does not currently use drugs after having used the following drugs: Marijuana.  Family History: family history is not on file.   Review of Systems: A full review of systems was performed and negative except as noted in the HPI.    Physical Exam:  Vital Signs: LMP 05/25/2023   General:   alert and cooperative  Skin:  normal  Neurologic:    Alert & oriented x 3  Lungs:   Nl effort  Heart:   regular rate and rhythm   Abdomen:  normal findings: soft, non-tender  Extremities: : non-tender, symmetric, no edema bilaterally.      Results for orders placed or performed during the hospital encounter of 02/22/24 (from the past 24 hours)  CBC     Status: Abnormal   Collection Time: 02/22/24 12:47 AM  Result Value Ref Range   WBC 9.5 4.0 - 10.5 K/uL   RBC 3.55 (L) 3.87 - 5.11 MIL/uL   Hemoglobin 11.3 (L) 12.0 - 15.0 g/dL   HCT 68.0 (L) 63.9 - 53.9 %   MCV 89.9 80.0 - 100.0 fL   MCH 31.8 26.0 - 34.0 pg   MCHC 35.4 30.0 - 36.0 g/dL   RDW 87.3 88.4 - 84.4 %   Platelets 345 150 - 400 K/uL   nRBC 0.0 0.0 - 0.2 %  Type and screen     Status: None (Preliminary result)   Collection Time: 02/22/24 12:47 AM  Result Value Ref Range   ABO/RH(D) PENDING    Antibody Screen PENDING    Sample Expiration      02/25/2024,2359 Performed at [facility], [address] [place], [place] [address]     Pertinent Results:  Prenatal Labs: Blood type/Rh A pos  Antibody screen neg  Rubella Immune  Varicella Immune  RPR NR  HBsAg Neg  HIV NR  GC neg  Chlamydia neg  Genetic screening negative  1 hour GTT  96  3 hour GTT   GBS Neg   FHT: FHR: 140 bpm, variability: moderate,  accelerations:  Present,  decelerations:  Absent Category/reactivity:  Category I TOCO: none SVE: Dilation: 1 / Effacement (%): 50 / Station: -3    Assessment:  Kaitlin Lewis is a 34 y.o. G1P0000 female at [redacted]w[redacted]d with macrosomia.   Plan:  1. Admit to Labor & Delivery; consents reviewed and obtained  2. Fetal Well being  - Fetal Tracing: cat I - GBS neg - Presentation: vtx confirmed by sve   3. Routine OB: - Prenatal labs reviewed, as above - Rh pos - CBC & T&S on admit - Clear fluids, IVF  4. Induction of Labor -  Contractions by external toco in place -  Plan for induction with cytotec  -  Plan for continuous fetal monitoring  -  Maternal pain control as desired: IVPM, nitrous, regional anesthesia - Anticipate  vaginal delivery  5. Post Partum Planning: - Infant feeding: formula  - Contraception: Mirena  - Tdap: Given 12/12/23  - Flu: Given 12/12/23  - RSV: Given 01/27/24   Makalya Nave, CNM 02/22/2024 2:50 AM       [1] No Known Allergies

## 2024-02-22 NOTE — Progress Notes (Signed)
 Bluford out.  AROM for clear. ~5cm cervix dilation. Pitocin  per protocol.  Garnette Mace, MD, [facility] 02/22/2024 4:34 PM

## 2024-02-22 NOTE — Anesthesia Preprocedure Evaluation (Signed)
"                                    Anesthesia Evaluation   Patient awake    Reviewed: Allergy & Precautions, NPO status , Patient's Chart, lab work & pertinent test results  Airway Mallampati: II  TM Distance: >3 FB Neck ROM: Full    Dental   Pulmonary Current Smoker   breath sounds clear to auscultation       Cardiovascular  Rhythm:Regular     Neuro/Psych  Headaches  Anxiety        GI/Hepatic ,GERD  ,,  Endo/Other    Renal/GU      Musculoskeletal   Abdominal   Peds  Hematology   Anesthesia Other Findings   Reproductive/Obstetrics                              Anesthesia Physical Anesthesia Plan  ASA: 2  Anesthesia Plan: Epidural   Post-op Pain Management:    Induction:   PONV Risk Score and Plan:   Airway Management Planned: Natural Airway  Additional Equipment:   Intra-op Plan:   Post-operative Plan:   Informed Consent: I have reviewed the patients History and Physical, chart, labs and discussed the procedure including the risks, benefits and alternatives for the proposed anesthesia with the patient or authorized representative who has indicated his/her understanding and acceptance.       Plan Discussed with:   Anesthesia Plan Comments:         Anesthesia Quick Evaluation  "

## 2024-02-22 NOTE — Progress Notes (Signed)
 L&D Note    Subjective:  Feeling pressure with contractions. Comfortable with epidural  Objective:    Current Vital Signs 24h Vital Sign Ranges  T 98.3 F (36.8 C) Temp  Avg: 98.2 F (36.8 C)  Min: 97.9 F (36.6 C)  Max: 98.3 F (36.8 C)  BP (!) 91/57 BP  Min: 90/75  Max: 131/75  HR 85 Pulse  Avg: 88.3  Min: 77  Max: 110  RR 18 Resp  Avg: 18  Min: 18  Max: 18  SaO2 99 %   SpO2  Avg: 99.3 %  Min: 98 %  Max: 100 %       Gen: alert, cooperative, no distress FHR: Baseline: 125 bpm, Variability: moderate, Accels: Present, Decels: intermittent late decels IUPC: placed  SVE: Dilation: 7 Effacement (%):70 Station: -1 Presentation: Vertex Exam by:: Aisha, CNM  Medications SCHEDULED MEDICATIONS   ammonia        lidocaine  (PF)       misoprostol        misoprostol   25 mcg Oral Q4H   And   misoprostol   25 mcg Vaginal Q4H   oxytocin        oxytocin  40 units in LR 1000 mL  333 mL Intravenous Once    MEDICATION INFUSIONS   lactated ringers  Stopped (02/22/24 1544)   lactated ringers      oxytocin  4 milli-units/min (02/22/24 1730)   oxytocin       PRN MEDICATIONS  acetaminophen , ammonia , fentaNYL  (SUBLIMAZE ) injection, lactated ringers , lidocaine  (PF), lidocaine  (PF), misoprostol , ondansetron , oxytocin , sodium citrate -citric acid , terbutaline    Assessment & Plan:  34 y.o. G1P0000 at [redacted]w[redacted]d admitted for IOL for suspected macrosomia -GBS: negative -IP Antibiotics: none -Membranes ruptured clear x ~2 hrs (1615) -Recheck:Evaluated by digital exam. -Preeclampsia:  no -Pain: none -Intervention: IV Pitocin  augmentation, change maternal position, anticipate vaginal delivery, place IUPC, and FSE placed -Pe_uterus_labor: Pitocin  at 4 mu/min. -Analgesia: regional anesthesia   Garnette Mace, CNM  02/22/2024 6:06 PM  [facility]

## 2024-02-22 NOTE — Progress Notes (Signed)
 Labor Check  Subj:  Complaints: some cramping, requiring small amount of IV pain meds   Obj:  BP (!) 125/91 (BP Location: Left Arm)   Pulse (!) 110   Temp 98.1 F (36.7 C) (Oral)   Resp 18   Ht 5' 1 (1.549 m)   Wt 77.4 kg   LMP 05/25/2023   BMI 32.24 kg/m     Cervix: Dilation: 1 / Effacement (%): 60 / Station: -3   Cook catheter placed using speculum without difficulty. 80 mL in uterine balloon. Vaginal balloon empty.   Baseline FHR: 155 beats/min   Variability: moderate   Accelerations: present   Decelerations: absent Contractions: present frequency: 3-4 q 10 min Overall assessment: cat 1  Female chaperone present for pelvic exam:   A/P: 34 y.o. G1P0000 female at [redacted]w[redacted]d with IOL, s/p SROM by ROM+.  1.  Labor: continue with cervical ripening.  May start low-dose pitocin  and balloon is in place.  2.  FWB: reassuring, Overall assessment: category 1  3.  GBS negative  4.  Pain: prn IV pain meds, planning epidural (may have any time desires) 5.  Recheck: prn. Check balloon per protocol   Garnette Mace, MD, [facility] 02/22/2024 11:57 AM

## 2024-02-23 ENCOUNTER — Encounter: Payer: Self-pay | Admitting: Obstetrics and Gynecology

## 2024-02-23 LAB — CBC
HCT: 27.1 % — ABNORMAL LOW (ref 36.0–46.0)
Hemoglobin: 9.3 g/dL — ABNORMAL LOW (ref 12.0–15.0)
MCH: 32 pg (ref 26.0–34.0)
MCHC: 34.3 g/dL (ref 30.0–36.0)
MCV: 93.1 fL (ref 80.0–100.0)
Platelets: 270 10*3/uL (ref 150–400)
RBC: 2.91 MIL/uL — ABNORMAL LOW (ref 3.87–5.11)
RDW: 12.8 % (ref 11.5–15.5)
WBC: 15.7 10*3/uL — ABNORMAL HIGH (ref 4.0–10.5)
nRBC: 0 % (ref 0.0–0.2)

## 2024-02-23 LAB — RESP PANEL BY RT-PCR (RSV, FLU A&B, COVID)  RVPGX2
Influenza A by PCR: NEGATIVE
Influenza B by PCR: NEGATIVE
Resp Syncytial Virus by PCR: NEGATIVE
SARS Coronavirus 2 by RT PCR: NEGATIVE

## 2024-02-23 LAB — TYPE AND SCREEN
ABO/RH(D): A POS
Antibody Screen: NEGATIVE

## 2024-02-23 LAB — ABO/RH: ABO/RH(D): A POS

## 2024-02-23 MED ORDER — ACETAMINOPHEN 325 MG PO TABS: 650.0000 mg | ORAL_TABLET | ORAL | Status: DC | PRN

## 2024-02-23 MED ORDER — GENTAMICIN SULFATE 40 MG/ML IJ SOLN
5.0000 mg/kg | INTRAVENOUS | Status: AC
  Administered 2024-02-23 – 2024-02-24 (×2): 300 mg via INTRAVENOUS
  Filled 2024-02-23 (×2): qty 7.5

## 2024-02-23 MED ORDER — DIBUCAINE (PERIANAL) 1 % EX OINT: 1.0000 | TOPICAL_OINTMENT | CUTANEOUS | Status: DC | PRN

## 2024-02-23 MED ORDER — SODIUM CHLORIDE 0.9 % IV SOLN
300.0000 mg | Freq: Once | INTRAVENOUS | Status: AC
  Administered 2024-02-23: 300 mg via INTRAVENOUS
  Filled 2024-02-23: qty 300

## 2024-02-23 MED ORDER — EPINEPHRINE 0.3 MG/0.3ML IJ SOAJ: 0.3000 mg | Freq: Once | INTRAMUSCULAR | Status: DC | PRN

## 2024-02-23 MED ORDER — ALBUTEROL SULFATE (2.5 MG/3ML) 0.083% IN NEBU: 2.5000 mg | INHALATION_SOLUTION | Freq: Once | RESPIRATORY_TRACT | Status: DC | PRN

## 2024-02-23 MED ORDER — SODIUM CHLORIDE 0.9 % IV SOLN: INTRAVENOUS | Status: AC | PRN

## 2024-02-23 MED ORDER — PRENATAL MULTIVITAMIN CH
1.0000 | ORAL_TABLET | Freq: Every day | ORAL | Status: DC
  Administered 2024-02-23 – 2024-02-24 (×2): 1 via ORAL
  Filled 2024-02-23 (×2): qty 1

## 2024-02-23 MED ORDER — SENNOSIDES-DOCUSATE SODIUM 8.6-50 MG PO TABS
2.0000 | ORAL_TABLET | ORAL | Status: DC
  Administered 2024-02-23 – 2024-02-24 (×2): 2 via ORAL
  Filled 2024-02-23 (×2): qty 2

## 2024-02-23 MED ORDER — SALINE SPRAY 0.65 % NA SOLN
1.0000 | NASAL | Status: DC | PRN
  Administered 2024-02-23: 1 via NASAL
  Filled 2024-02-23: qty 44

## 2024-02-23 MED ORDER — IBUPROFEN 600 MG PO TABS
600.0000 mg | ORAL_TABLET | Freq: Four times a day (QID) | ORAL | Status: DC
  Administered 2024-02-23 – 2024-02-24 (×7): 600 mg via ORAL
  Filled 2024-02-23 (×7): qty 1

## 2024-02-23 MED ORDER — SODIUM CHLORIDE 0.9 % IV BOLUS: 500.0000 mL | Freq: Once | INTRAVENOUS | Status: DC | PRN

## 2024-02-23 MED ORDER — DIPHENHYDRAMINE HCL 50 MG/ML IJ SOLN: 25.0000 mg | Freq: Once | INTRAMUSCULAR | Status: DC | PRN

## 2024-02-23 MED ORDER — ONDANSETRON HCL 4 MG/2ML IJ SOLN: 4.0000 mg | INTRAMUSCULAR | Status: DC | PRN

## 2024-02-23 MED ORDER — WITCH HAZEL-GLYCERIN EX PADS
1.0000 | MEDICATED_PAD | CUTANEOUS | Status: DC | PRN
  Filled 2024-02-23 (×2): qty 100

## 2024-02-23 MED ORDER — CLINDAMYCIN PHOSPHATE 900 MG/50ML IV SOLN
900.0000 mg | Freq: Three times a day (TID) | INTRAVENOUS | Status: DC
  Administered 2024-02-23 – 2024-02-24 (×4): 900 mg via INTRAVENOUS
  Filled 2024-02-23 (×6): qty 50

## 2024-02-23 MED ORDER — COCONUT OIL OIL: 1.0000 | TOPICAL_OIL | Status: DC | PRN

## 2024-02-23 MED ORDER — SIMETHICONE 80 MG PO CHEW: 80.0000 mg | CHEWABLE_TABLET | ORAL | Status: DC | PRN

## 2024-02-23 MED ORDER — ONDANSETRON HCL 4 MG PO TABS: 4.0000 mg | ORAL_TABLET | ORAL | Status: DC | PRN

## 2024-02-23 MED ORDER — METHYLPREDNISOLONE SODIUM SUCC 125 MG IJ SOLR: 125.0000 mg | Freq: Once | INTRAMUSCULAR | Status: DC | PRN

## 2024-02-23 MED ORDER — FERROUS SULFATE 325 (65 FE) MG PO TABS
325.0000 mg | ORAL_TABLET | Freq: Two times a day (BID) | ORAL | Status: DC
  Administered 2024-02-23 – 2024-02-24 (×3): 325 mg via ORAL
  Filled 2024-02-23 (×3): qty 1

## 2024-02-23 MED ORDER — BENZOCAINE-MENTHOL 20-0.5 % EX AERO
1.0000 | INHALATION_SPRAY | CUTANEOUS | Status: DC | PRN
  Filled 2024-02-23 (×2): qty 56

## 2024-02-23 MED ORDER — DIPHENHYDRAMINE HCL 25 MG PO CAPS: 25.0000 mg | ORAL_CAPSULE | Freq: Four times a day (QID) | ORAL | Status: DC | PRN

## 2024-02-23 NOTE — Anesthesia Post-op Follow-up Note (Signed)
" °  Anesthesia Pain Follow-up Note  Patient: Kaitlin Lewis  Day #: 1  Date of Follow-up: 02/23/2024 Time: 3:55 PM  Last Vitals:  Vitals:   02/23/24 1159 02/23/24 1529  BP: 107/66 109/74  Pulse: 82 81  Resp: 19 18  Temp: 36.8 C 36.9 C  SpO2:      Level of Consciousness: alert  Pain: mild   Side Effects:Pruritis  Catheter Site Exam:clean, dry     Plan: D/C from anesthesia care at surgeon's request  Prentice Murphy     "

## 2024-02-23 NOTE — Progress Notes (Signed)
 Pharmacy Antibiotic Note  Kaitlin Lewis is a 34 y.o. female admitted on 02/22/2024 with post-partum endometritis.  Pharmacy has been consulted for Gentamicin  dosing.  Plan: Gentamicin  300 mg (5 mg/kg Adj BW) IV Q24H X 2 days ordered to start on 2/1 @ ~ 0200.  - anticipate pt will not be on gentamicin  > 48 hrs, no levels ordered - will f/u plans for gent on 2/2 AM , order levels PRN   Height: 5' 1 (154.9 cm) Weight: 77.4 kg (170 lb 10.2 oz) IBW/kg (Calculated) : 47.8  Temp (24hrs), Avg:98.8 F (37.1 C), Min:97.9 F (36.6 C), Max:101.6 F (38.7 C)  Recent Labs  Lab 02/22/24 0047  WBC 9.5    Estimated Creatinine Clearance: 93.2 mL/min (by C-G formula based on SCr of 0.53 mg/dL).    Allergies[1]  Antimicrobials this admission:   >>    >>   Dose adjustments this admission:   Microbiology results:  BCx:   UCx:    Sputum:    MRSA PCR:   Thank you for allowing pharmacy to be a part of this patients care.  Azariel Banik D 02/23/2024 2:18 AM     [1] No Known Allergies

## 2024-02-24 ENCOUNTER — Inpatient Hospital Stay: Payer: MEDICAID

## 2024-02-24 MED ORDER — SIMETHICONE 80 MG PO CHEW: 80.0000 mg | CHEWABLE_TABLET | ORAL | Status: AC | PRN

## 2024-02-24 MED ORDER — WITCH HAZEL-GLYCERIN EX PADS: 1.0000 | MEDICATED_PAD | CUTANEOUS | Status: AC | PRN

## 2024-02-24 MED ORDER — SENNOSIDES-DOCUSATE SODIUM 8.6-50 MG PO TABS: 2.0000 | ORAL_TABLET | ORAL | Status: AC

## 2024-02-24 MED ORDER — DIBUCAINE (PERIANAL) 1 % EX OINT: 1.0000 | TOPICAL_OINTMENT | CUTANEOUS | Status: AC | PRN

## 2024-02-24 MED ORDER — BENZOCAINE-MENTHOL 20-0.5 % EX AERO: 1.0000 | INHALATION_SPRAY | CUTANEOUS | Status: AC | PRN

## 2024-02-24 MED ORDER — IBUPROFEN 600 MG PO TABS: 600.0000 mg | ORAL_TABLET | Freq: Four times a day (QID) | ORAL | 0 refills | Status: AC

## 2024-02-24 MED ORDER — COCONUT OIL OIL: 1.0000 | TOPICAL_OIL | Status: AC | PRN

## 2024-02-24 NOTE — Discharge Instructions (Signed)
Discharge instructions Bleeding: Your bleeding could continue up to 6 weeks, the flow should gradually decrease and the color should become dark then lightened over the next couple of weeks. If you notice you are bleeding heavily or passing clots larger than the size of your fist, PLEASE call your physician. No TAMPONS, DOUCHING, ENEMAS OR SEXUAL INTERCOURSE for 6 weeks. Stitches: Shower daily with mild soap and water. Stitches will dissolve over the next couple of weeks, if you experience any discomfort in the vaginal area you may sit in warm water 15-20 minutes, 3-4 times per day. Just enough water to cover vaginal area. AfterPains: This is the uterus contracting back to its normal position and size. Use medications prescribed or recommended by your physician to help relieve this discomfort. Bowels/Hemorrhoids: Drink plenty of water and stay active. Increase fiber, fresh fruits and vegetables in your diet. Rest/Activity: Rest when the baby is resting;  Do not lift > 10 lbs for 6 weeks. No driving for 1-2 weeks. Bathing: Shower daily! Diet: Continue daily prenatal vitamin and iron until your follow up visit to help replenish nutrients and vitamins. If breastfeeding eat extra calories and increase your fluid intake to 12 glasses a day. Contraception: Consult with your provider on what method of birth control you would like to use. Breastfeeding: You may have a slight fever when your milk comes in, but it should go away on its own. If it does not, and rises above 101.0 please call the doctor. Bottlefeeding: wear a snug fitting bra without underwires continuously for 3-5days, avoid any nipple/breast stimulation. If engorgement occurs, take ibuprofen as prescribed and apply fresh green cabbage leaves directly to your breasts inside the bra cups. Postpartum "BLUES": It is common to emotional days after delivery, however if it persist for greater than 2 weeks or if you feel concerned please let your physician  know immediately. This is hormone driven and nothing you can control so please let someone know how you feel. Follow Up Visit: Please schedule a follow up visit with your delivering provider  Call office if you have any of the following: headache, visual changes, fever >101.0 F, chills, breast concerns, excessive vaginal bleeding, incision drainage or problems, leg pain or redness, depression or any other concerns.  For concerns about your baby, please call your pediatrician For breastfeeding concerns, the lactation consultant can be reached at [phone]

## 2024-02-24 NOTE — Discharge Summary (Signed)
 Postpartum Discharge Summary  Patient Name: Kaitlin Lewis DOB: 1990-05-11 MRN: [id number]  Date of admission: 02/22/2024 Delivery date:02/22/2024 Delivering provider: Dallie Patton Date of discharge: 02/24/2024  Primary OB: [facility] OFE:Ejupzwu'd last menstrual period was 05/25/2023. EDC Estimated Date of Delivery: 02/29/24 Gestational Age at Delivery: [redacted]w[redacted]d   Admitting diagnosis: Encounter for planned induction of labor [Z34.90] Intrauterine pregnancy: [redacted]w[redacted]d     Secondary diagnosis:   Principal Problem:   Encounter for planned induction of labor   Discharge Diagnosis: Term Pregnancy Delivered and Anemia      Hospital course: The patient is a 34 year old G1P1001 at 39 weeks 0 days gestation who was admitted on 02/22/2024 for induction of labor due to fetal macrosomia. Her labor course was complicated by intermittent Category II fetal heart tracings. Membranes ruptured on 02/22/2024 at 16:15, and she subsequently had a spontaneous vaginal delivery without operative assistance or episiotomy. She sustained labial, vaginal, and second-degree perineal lacerations; additional delivery details are documented in the separate delivery note. Her postpartum course was complicated by chorioamnionitis, for which she was treated with IV clindamycin , acute blood loss anemia, and chest pain. A stat chest X-ray was obtained and was unremarkable. Vital signs remained stable and reassuring, and the patient reports improvement in chest pain, now described as musculoskeletal in nature. She was started on oral iron  twice daily and received IV iron  sucrose (Venofer ). The patient was clinically stable and discharged home on 02/24/2024 with routine postpartum follow-up and return precautions.  Newborn Data: Birth date:02/22/2024 Birth time:9:38 PM Gender:Female Living status:Living Apgars:5 ,8  Weight:4190 g                                            Post partum procedures:none Induction:: AROM,  Pitocin , Cytotec , and IP Foley Complications: difficulty delivering fetal shoulder and body after delivery of head due to lack of maternal effort Delivery Type: spontaneous vaginal delivery Anesthesia: epidural anesthesia Placenta: spontaneous To Pathology: No   Prenatal Labs:   Blood type/Rh A pos  Antibody screen neg  Rubella Immune  Varicella Immune  RPR NR  HBsAg Neg  HIV NR  GC neg  Chlamydia neg  Genetic screening negative  1 hour GTT 96  3 hour GTT    GBS Neg    Magnesium  Sulfate received: No BMZ received: No Rhophylac:was not indicated MMR: was not indicated Varivax vaccine given: was not indicated -Tdap: Given 12/12/23  - Flu: Given 12/12/23  - RSV: Given 01/27/24  Transfusion:Yes IV Venofer   Physical exam  Vitals:   02/23/24 1159 02/23/24 1529 02/23/24 2313 02/24/24 0815  BP: 107/66 109/74 95/61 105/66  Pulse: 82 81 78 83  Resp: 19 18 18 17   Temp: 98.3 F (36.8 C) 98.5 F (36.9 C) 98 F (36.7 C) 98.3 F (36.8 C)  TempSrc: Oral Oral Oral Oral  SpO2:   96% 100%  Weight:      Height:       General: alert, cooperative, and no distress Lochia: appropriate Uterine Fundus: firm Perineum:minimal edema/repair well approximated DVT Evaluation: No evidence of DVT seen on physical exam. Negative Homan's sign. No cords or calf tenderness. No significant calf/ankle edema.  Labs: Lab Results  Component Value Date   WBC 15.7 (H) 02/23/2024   HGB 9.3 (L) 02/23/2024   HCT 27.1 (L) 02/23/2024   MCV 93.1 02/23/2024   PLT 270 02/23/2024  Latest Ref Rng & Units 02/13/2024    8:19 PM  CMP  Glucose 70 - 99 mg/dL 75   BUN 6 - 20 mg/dL 5   Creatinine 9.55 - 8.99 mg/dL 9.46   Sodium 864 - 854 mmol/L 137   Potassium 3.5 - 5.1 mmol/L 3.9   Chloride 98 - 111 mmol/L 104   CO2 22 - 32 mmol/L 21   Calcium 8.9 - 10.3 mg/dL 9.6   Total Protein 6.5 - 8.1 g/dL 6.3   Total Bilirubin 0.0 - 1.2 mg/dL 0.3   Alkaline Phos 38 - 126 U/L 147   AST 15 - 41 U/L 39    ALT 0 - 44 U/L 19    Edinburgh Score:    02/23/2024    2:06 PM  Edinburgh Postnatal Depression Scale Screening Tool  I have been able to laugh and see the funny side of things. 0  I have looked forward with enjoyment to things. 0  I have blamed myself unnecessarily when things went wrong. 0  I have been anxious or worried for no good reason. 0  I have felt scared or panicky for no good reason. 0  Things have been getting on top of me. 1  I have been so unhappy that I have had difficulty sleeping. 0  I have felt sad or miserable. 0  I have been so unhappy that I have been crying. 0  The thought of harming myself has occurred to me. 0  Edinburgh Postnatal Depression Scale Total 1     Postpartum VTE Prophylaxis  Recommend 6 weeks of prophylactic anticoagulation with LMWH or subcutaneous unfractionated heparin if 1 or more high risk factor is present.  Recommend 14 days of prophylactic anticoagulation with LMWH or subcutaneous unfractionated heparin if 3 or more moderate risk factors are present.   Risk assessment for postpartum VTE and prophylactic treatment: High risk factors: None Moderate risk factors: None  Postpartum VTE prophylaxis with LMWH not indicated    After visit meds:  Allergies as of 02/24/2024   No Known Allergies      Medication List     STOP taking these medications    prenatal multivitamin Tabs tablet       TAKE these medications    benzocaine -Menthol  20-0.5 % Aero Commonly known as: DERMOPLAST Apply 1 Application topically as needed for irritation (perineal discomfort).   buPROPion 300 MG 24 hr tablet Commonly known as: WELLBUTRIN XL Take 300 mg by mouth daily.   coconut oil Oil Apply 1 Application topically as needed.   dibucaine 1 % Oint Commonly known as: NUPERCAINAL Place 1 Application rectally as needed for hemorrhoids.   ferrous sulfate  325 (65 FE) MG EC tablet Take 325 mg by mouth 3 (three) times daily with meals.   ibuprofen   600 MG tablet Commonly known as: ADVIL  Take 1 tablet (600 mg total) by mouth every 6 (six) hours.   senna-docusate 8.6-50 MG tablet Commonly known as: Senokot-S Take 2 tablets by mouth daily. Start taking on: February 25, 2024   simethicone  80 MG chewable tablet Commonly known as: MYLICON Chew 1 tablet (80 mg total) by mouth as needed for flatulence.   witch hazel-glycerin  pad Commonly known as: TUCKS Apply 1 Application topically as needed for hemorrhoids.       Discharge home in stable condition Infant Feeding: Bottle Infant Disposition:home with mother Discharge instruction: per After Visit Summary and Postpartum booklet. Activity: Advance as tolerated. Pelvic rest for 6 weeks.  Diet: routine  diet Anticipated Birth Control:  Contraceptives: IUD Mirena Postpartum Appointment:6 weeks Additional Postpartum F/U: Postpartum Depression checkup Future Appointments:No future appointments. Follow up Visit:  Follow-up Information     Aisha Heller, CNM. Schedule an appointment as soon as possible for a visit in 2 week(s).   Specialty: Obstetrics Why: mood check Contact information: [address] [place] [address] [phone]         Aisha Heller, CNM. Schedule an appointment as soon as possible for a visit in 6 week(s).   Specialty: Obstetrics Why: postpartum visit/Mirena placement Contact information: [address] [place] [address] [phone]                 Plan:  Kaitlin Lewis was discharged to home in good condition. Follow-up appointment as directed.    Signed: Heller Aisha CNM

## 2024-02-24 NOTE — Progress Notes (Signed)
Patient discharged. Discharge instructions given. Patient verbalizes understanding. Transported by RN. 

## 2024-02-25 ENCOUNTER — Encounter: Payer: Self-pay | Admitting: Obstetrics and Gynecology
